# Patient Record
Sex: Female | Born: 1970
Health system: Southern US, Community
[De-identification: ages and names within clinical notes are randomized; demographics above are authoritative.]

## PROBLEM LIST (undated history)

## (undated) DIAGNOSIS — T7840XA Allergy, unspecified, initial encounter: Secondary | ICD-10-CM

## (undated) HISTORY — DX: Allergy, unspecified, initial encounter: T78.40XA

## (undated) HISTORY — PX: WISDOM TOOTH EXTRACTION: SHX21

---

## 2003-07-18 ENCOUNTER — Other Ambulatory Visit: Admission: RE | Admit: 2003-07-18 | Discharge: 2003-07-18 | Payer: Self-pay | Admitting: Obstetrics and Gynecology

## 2004-08-02 ENCOUNTER — Other Ambulatory Visit: Admission: RE | Admit: 2004-08-02 | Discharge: 2004-08-02 | Payer: Self-pay | Admitting: Obstetrics and Gynecology

## 2005-08-22 ENCOUNTER — Other Ambulatory Visit: Admission: RE | Admit: 2005-08-22 | Discharge: 2005-08-22 | Payer: Self-pay | Admitting: Obstetrics and Gynecology

## 2017-07-04 DIAGNOSIS — Z1231 Encounter for screening mammogram for malignant neoplasm of breast: Secondary | ICD-10-CM | POA: Diagnosis not present

## 2017-07-04 DIAGNOSIS — Z681 Body mass index (BMI) 19 or less, adult: Secondary | ICD-10-CM | POA: Diagnosis not present

## 2017-07-04 DIAGNOSIS — Z01419 Encounter for gynecological examination (general) (routine) without abnormal findings: Secondary | ICD-10-CM | POA: Diagnosis not present

## 2017-07-14 DIAGNOSIS — Z808 Family history of malignant neoplasm of other organs or systems: Secondary | ICD-10-CM | POA: Diagnosis not present

## 2017-07-14 DIAGNOSIS — Z8041 Family history of malignant neoplasm of ovary: Secondary | ICD-10-CM | POA: Diagnosis not present

## 2017-07-14 DIAGNOSIS — Z803 Family history of malignant neoplasm of breast: Secondary | ICD-10-CM | POA: Diagnosis not present

## 2017-07-14 DIAGNOSIS — Z800 Family history of malignant neoplasm of digestive organs: Secondary | ICD-10-CM | POA: Diagnosis not present

## 2017-08-09 DIAGNOSIS — Z0000 Encounter for general adult medical examination without abnormal findings: Secondary | ICD-10-CM | POA: Diagnosis not present

## 2018-01-10 DIAGNOSIS — D225 Melanocytic nevi of trunk: Secondary | ICD-10-CM | POA: Diagnosis not present

## 2018-01-10 DIAGNOSIS — L700 Acne vulgaris: Secondary | ICD-10-CM | POA: Diagnosis not present

## 2018-01-20 DIAGNOSIS — Z23 Encounter for immunization: Secondary | ICD-10-CM | POA: Diagnosis not present

## 2018-08-30 DIAGNOSIS — Z681 Body mass index (BMI) 19 or less, adult: Secondary | ICD-10-CM | POA: Diagnosis not present

## 2018-08-30 DIAGNOSIS — Z01419 Encounter for gynecological examination (general) (routine) without abnormal findings: Secondary | ICD-10-CM | POA: Diagnosis not present

## 2018-08-30 DIAGNOSIS — Z1231 Encounter for screening mammogram for malignant neoplasm of breast: Secondary | ICD-10-CM | POA: Diagnosis not present

## 2018-09-28 DIAGNOSIS — Z0000 Encounter for general adult medical examination without abnormal findings: Secondary | ICD-10-CM | POA: Diagnosis not present

## 2018-10-02 DIAGNOSIS — R7989 Other specified abnormal findings of blood chemistry: Secondary | ICD-10-CM | POA: Diagnosis not present

## 2018-10-04 DIAGNOSIS — Z0000 Encounter for general adult medical examination without abnormal findings: Secondary | ICD-10-CM | POA: Diagnosis not present

## 2018-10-04 DIAGNOSIS — Z1331 Encounter for screening for depression: Secondary | ICD-10-CM | POA: Diagnosis not present

## 2019-02-15 DIAGNOSIS — Z23 Encounter for immunization: Secondary | ICD-10-CM | POA: Diagnosis not present

## 2019-03-12 DIAGNOSIS — D2261 Melanocytic nevi of right upper limb, including shoulder: Secondary | ICD-10-CM | POA: Diagnosis not present

## 2019-03-12 DIAGNOSIS — B078 Other viral warts: Secondary | ICD-10-CM | POA: Diagnosis not present

## 2019-03-12 DIAGNOSIS — D1801 Hemangioma of skin and subcutaneous tissue: Secondary | ICD-10-CM | POA: Diagnosis not present

## 2019-03-12 DIAGNOSIS — D485 Neoplasm of uncertain behavior of skin: Secondary | ICD-10-CM | POA: Diagnosis not present

## 2019-03-12 DIAGNOSIS — D2271 Melanocytic nevi of right lower limb, including hip: Secondary | ICD-10-CM | POA: Diagnosis not present

## 2019-03-12 DIAGNOSIS — D225 Melanocytic nevi of trunk: Secondary | ICD-10-CM | POA: Diagnosis not present

## 2019-07-19 ENCOUNTER — Ambulatory Visit: Payer: Self-pay | Attending: Internal Medicine

## 2019-07-19 DIAGNOSIS — Z23 Encounter for immunization: Secondary | ICD-10-CM

## 2019-07-19 NOTE — Progress Notes (Signed)
   Covid-19 Vaccination Clinic  Name:  Gloria Turner    MRN: [id number] DOB: 01/05/1971  07/19/2019  [name] was observed post Covid-19 immunization for 15 minutes without incident. She was provided with Vaccine Information Sheet and instruction to access the V-Safe system.   [name] was instructed to call 911 with any severe reactions post vaccine: Marland Kitchen Difficulty breathing  . Swelling of face and throat  . A fast heartbeat  . A bad rash all over body  . Dizziness and weakness   Immunizations Administered    Name Date Dose VIS Date Route   Pfizer COVID-19 Vaccine 07/19/2019 12:26 PM 0.3 mL 04/19/2019 Intramuscular   Manufacturer: [employer], [employer]   Lot: [id number]   NDC: [address]-3

## 2019-07-23 DIAGNOSIS — L570 Actinic keratosis: Secondary | ICD-10-CM | POA: Diagnosis not present

## 2019-08-13 ENCOUNTER — Ambulatory Visit: Payer: Self-pay | Attending: Internal Medicine

## 2019-08-13 DIAGNOSIS — Z23 Encounter for immunization: Secondary | ICD-10-CM

## 2019-10-15 DIAGNOSIS — Z681 Body mass index (BMI) 19 or less, adult: Secondary | ICD-10-CM | POA: Diagnosis not present

## 2019-10-15 DIAGNOSIS — Z13220 Encounter for screening for lipoid disorders: Secondary | ICD-10-CM | POA: Diagnosis not present

## 2019-10-15 DIAGNOSIS — Z1231 Encounter for screening mammogram for malignant neoplasm of breast: Secondary | ICD-10-CM | POA: Diagnosis not present

## 2019-10-15 DIAGNOSIS — Z01419 Encounter for gynecological examination (general) (routine) without abnormal findings: Secondary | ICD-10-CM | POA: Diagnosis not present

## 2020-02-20 DIAGNOSIS — H6592 Unspecified nonsuppurative otitis media, left ear: Secondary | ICD-10-CM | POA: Diagnosis not present

## 2020-03-31 DIAGNOSIS — L570 Actinic keratosis: Secondary | ICD-10-CM | POA: Diagnosis not present

## 2020-03-31 DIAGNOSIS — D2262 Melanocytic nevi of left upper limb, including shoulder: Secondary | ICD-10-CM | POA: Diagnosis not present

## 2020-03-31 DIAGNOSIS — Z419 Encounter for procedure for purposes other than remedying health state, unspecified: Secondary | ICD-10-CM | POA: Diagnosis not present

## 2020-03-31 DIAGNOSIS — D2271 Melanocytic nevi of right lower limb, including hip: Secondary | ICD-10-CM | POA: Diagnosis not present

## 2020-03-31 DIAGNOSIS — D1801 Hemangioma of skin and subcutaneous tissue: Secondary | ICD-10-CM | POA: Diagnosis not present

## 2020-04-17 DIAGNOSIS — H9312 Tinnitus, left ear: Secondary | ICD-10-CM | POA: Diagnosis not present

## 2020-04-17 DIAGNOSIS — H6983 Other specified disorders of Eustachian tube, bilateral: Secondary | ICD-10-CM | POA: Diagnosis not present

## 2020-05-11 DIAGNOSIS — Z03818 Encounter for observation for suspected exposure to other biological agents ruled out: Secondary | ICD-10-CM | POA: Diagnosis not present

## 2020-05-18 DIAGNOSIS — Z03818 Encounter for observation for suspected exposure to other biological agents ruled out: Secondary | ICD-10-CM | POA: Diagnosis not present

## 2020-06-26 ENCOUNTER — Encounter: Payer: Self-pay | Admitting: Internal Medicine

## 2020-07-09 ENCOUNTER — Telehealth: Payer: Self-pay | Admitting: Internal Medicine

## 2020-07-09 NOTE — Telephone Encounter (Signed)
Communication with patient's husband who is an anesthesiologist.  She currently has an April 13 appointment in the [facility] but they prefer to do it at [facility] with his groups anesthesia.   Please look at the following dates and times to see what may work for [place].  I know these dates are acceptable for them.  April 12 midday  April 13 midday 1230-if we choose this then blocked her appointment or block out the 10:00 so it cannot be filled (that would be better) so that I make it on time  May 5 730 or midday  May 23 I am off this day but will do it preferably at 730-ish  Please let me know what we come up with and also contact the patient to let her know what we are doing as may need to adjust prep instructions and she will need a hospital Covid test at least as things are today  You can wait until Monday to contact them because she is on vacation this week but if these dates work for me go ahead and set it up because I know they are good for them

## 2020-07-10 ENCOUNTER — Other Ambulatory Visit: Payer: Self-pay

## 2020-07-10 DIAGNOSIS — Z1211 Encounter for screening for malignant neoplasm of colon: Secondary | ICD-10-CM

## 2020-07-10 NOTE — Telephone Encounter (Signed)
Case has been rescheduled to [facility] on 08/18/20 at 12:30.  She will require a COVID screen on 08/14/20 at 10:30.  She has pre-visit scheduled for 08/05/20.  [facility] case cancelled.  I will contact patient on Monday with the details.

## 2020-07-13 NOTE — Telephone Encounter (Signed)
Left message for patient to call back  

## 2020-07-14 NOTE — Telephone Encounter (Signed)
Patient notified of the dates and times.  They all work for her.

## 2020-08-05 ENCOUNTER — Ambulatory Visit (AMBULATORY_SURGERY_CENTER): Payer: BC Managed Care – PPO

## 2020-08-05 ENCOUNTER — Other Ambulatory Visit: Payer: Self-pay

## 2020-08-05 ENCOUNTER — Telehealth: Payer: Self-pay

## 2020-08-05 VITALS — Ht 63.5 in | Wt 108.0 lb

## 2020-08-05 DIAGNOSIS — Z1211 Encounter for screening for malignant neoplasm of colon: Secondary | ICD-10-CM

## 2020-08-05 NOTE — Telephone Encounter (Signed)
Gloria Turner came for her pre-visit today and she was unaware that her covid screen has to be done 08/14/20 and she will be out of town. They no longer test on Saturdays. Her procedure is at [facility] on 08/18/20. Any suggestions Sir?

## 2020-08-05 NOTE — Progress Notes (Signed)
Pre visit completed vis phone call; Patient verified name, DOB, and address; No egg or soy allergy known to patient  No issues with past sedation with any surgeries or procedures Patient denies ever being told they had issues or difficulty with intubation  No FH of Malignant Hyperthermia No diet pills per patient No home 02 use per patient  No blood thinners per patient  Pt denies issues with constipation  No A fib or A flutter  EMMI video via MyChart  COVID 19 guidelines implemented in [facility] today with Pt and RN  NO PA's for preps discussed with pt in [facility] today  Discussed with pt there will be an out-of-pocket cost for prep and that varies from $0 to 70 dollars  Due to the COVID-19 pandemic we are asking patients to follow certain guidelines.  Pt aware of COVID protocols and [facility] guidelines

## 2020-08-06 NOTE — Telephone Encounter (Signed)
I left her a message on both numbers that her covid test has been moved to 08/14/20 at 2:45pm. That is the latest they do.

## 2020-08-06 NOTE — Telephone Encounter (Signed)
I have communicated with the patient and Debi Scientist, research (life sciences).  It is probably fine to do the test on Monday morning for her Tuesday afternoon procedure but the patient think she can be in town late Friday afternoon  see if a Friday afternoon appointment is possible and sort that out with her -

## 2020-08-10 NOTE — Telephone Encounter (Signed)
Left her another message on the work #, the home # rang and rang and then cut off.

## 2020-08-14 ENCOUNTER — Other Ambulatory Visit (HOSPITAL_COMMUNITY): Payer: Self-pay

## 2020-08-14 ENCOUNTER — Other Ambulatory Visit (HOSPITAL_COMMUNITY)
Admission: RE | Admit: 2020-08-14 | Discharge: 2020-08-14 | Disposition: A | Discharge status: Home / Self Care | Payer: BC Managed Care – PPO | Source: Ambulatory Visit | Attending: Internal Medicine | Admitting: Internal Medicine

## 2020-08-14 DIAGNOSIS — Z20822 Contact with and (suspected) exposure to covid-19: Secondary | ICD-10-CM | POA: Insufficient documentation

## 2020-08-14 DIAGNOSIS — Z01812 Encounter for preprocedural laboratory examination: Secondary | ICD-10-CM | POA: Insufficient documentation

## 2020-08-14 LAB — SARS CORONAVIRUS 2 (TAT 6-24 HRS): SARS Coronavirus 2: NEGATIVE

## 2020-08-17 NOTE — Telephone Encounter (Signed)
I see in the computer she made it to her covid screening appointment Friday.

## 2020-08-18 ENCOUNTER — Encounter (HOSPITAL_COMMUNITY)
Admission: RE | Disposition: A | Discharge status: Home / Self Care | Payer: Self-pay | Source: Home / Self Care | Attending: Internal Medicine

## 2020-08-18 ENCOUNTER — Ambulatory Visit (HOSPITAL_COMMUNITY)
Admission: RE | Admit: 2020-08-18 | Discharge: 2020-08-18 | Disposition: A | Discharge status: Home / Self Care | Payer: BC Managed Care – PPO | Attending: Internal Medicine | Admitting: Internal Medicine

## 2020-08-18 ENCOUNTER — Ambulatory Visit (HOSPITAL_COMMUNITY): Payer: BC Managed Care – PPO | Admitting: Certified Registered Nurse Anesthetist

## 2020-08-18 ENCOUNTER — Other Ambulatory Visit: Payer: Self-pay

## 2020-08-18 ENCOUNTER — Encounter (HOSPITAL_COMMUNITY): Payer: Self-pay | Admitting: Internal Medicine

## 2020-08-18 DIAGNOSIS — Z8371 Family history of colonic polyps: Secondary | ICD-10-CM | POA: Diagnosis not present

## 2020-08-18 DIAGNOSIS — Z1211 Encounter for screening for malignant neoplasm of colon: Secondary | ICD-10-CM | POA: Diagnosis not present

## 2020-08-18 DIAGNOSIS — Z800 Family history of malignant neoplasm of digestive organs: Secondary | ICD-10-CM | POA: Insufficient documentation

## 2020-08-18 DIAGNOSIS — Z79899 Other long term (current) drug therapy: Secondary | ICD-10-CM | POA: Insufficient documentation

## 2020-08-18 HISTORY — PX: COLONOSCOPY WITH PROPOFOL: SHX5780

## 2020-08-18 SURGERY — COLONOSCOPY WITH PROPOFOL
Anesthesia: Monitor Anesthesia Care

## 2020-08-18 MED ORDER — PROPOFOL 500 MG/50ML IV EMUL
INTRAVENOUS | Status: AC
  Filled 2020-08-18: qty 50

## 2020-08-18 MED ORDER — ONDANSETRON HCL 4 MG/2ML IJ SOLN
INTRAMUSCULAR | Status: DC | PRN
  Administered 2020-08-18: 4 mg via INTRAVENOUS

## 2020-08-18 MED ORDER — PROPOFOL 500 MG/50ML IV EMUL
INTRAVENOUS | Status: DC | PRN
  Administered 2020-08-18: 100 ug/kg/min via INTRAVENOUS

## 2020-08-18 MED ORDER — PROPOFOL 10 MG/ML IV BOLUS
INTRAVENOUS | Status: DC | PRN
  Administered 2020-08-18 (×3): 20 mg via INTRAVENOUS

## 2020-08-18 MED ORDER — LACTATED RINGERS IV SOLN
INTRAVENOUS | Status: DC
  Administered 2020-08-18: 1000 mL via INTRAVENOUS

## 2020-08-18 SURGICAL SUPPLY — 21 items

## 2020-08-18 NOTE — Anesthesia Procedure Notes (Signed)
Procedure Name: MAC Date/Time: 08/18/2020 12:36 PM Performed by: Maxwell Caul, CRNA Pre-anesthesia Checklist: Patient identified, Emergency Drugs available, Suction available and Patient being monitored Oxygen Delivery Method: Simple face mask

## 2020-08-18 NOTE — Anesthesia Preprocedure Evaluation (Addendum)
Anesthesia Evaluation  Patient identified by MRN, date of birth, ID band Patient awake    Reviewed: Allergy & Precautions, NPO status , Patient's Chart, lab work & pertinent test results  Airway Mallampati: II  TM Distance: >3 FB Neck ROM: Full    Dental no notable dental hx.    Pulmonary neg pulmonary ROS,    Pulmonary exam normal breath sounds clear to auscultation       Cardiovascular negative cardio ROS Normal cardiovascular exam Rhythm:Regular Rate:Normal     Neuro/Psych negative neurological ROS  negative psych ROS   GI/Hepatic Neg liver ROS, Bowel prep,  Endo/Other  negative endocrine ROS  Renal/GU negative Renal ROS     Musculoskeletal negative musculoskeletal ROS (+)   Abdominal   Peds  Hematology negative hematology ROS (+)   Anesthesia Other Findings screening colonoscopy  Reproductive/Obstetrics                           Anesthesia Physical Anesthesia Plan  ASA: I  Anesthesia Plan: MAC   Post-op Pain Management:    Induction: Intravenous  PONV Risk Score and Plan: 2 and Propofol infusion and Treatment may vary due to age or medical condition  Airway Management Planned: Simple Face Mask  Additional Equipment:   Intra-op Plan:   Post-operative Plan:   Informed Consent: I have reviewed the patients History and Physical, chart, labs and discussed the procedure including the risks, benefits and alternatives for the proposed anesthesia with the patient or authorized representative who has indicated his/her understanding and acceptance.     Dental advisory given  Plan Discussed with: CRNA  Anesthesia Plan Comments:        Anesthesia Quick Evaluation

## 2020-08-18 NOTE — H&P (Signed)
  [facility] History and Physical   Primary Care Physician:  Chilton Greathouse, MD   Reason for Procedure:   colon cancer screening  Plan:    colonoscopy     HPI: Gloria Turner is a 50 y.o. female here for screening colonoscopy.   Past Medical History:  Diagnosis Date  . Allergy    seasonal allergies    Past Surgical History:  Procedure Laterality Date  . CESAREAN SECTION     x 2  . WISDOM TOOTH EXTRACTION      Prior to Admission medications   Medication Sig Start Date End Date Taking? Authorizing Provider  fluticasone (FLONASE) 50 MCG/ACT nasal spray Place 1 spray into both nostrils daily as needed for allergies or rhinitis (Seasonal).   Yes [provider]    No current facility-administered medications for this encounter.    Allergies as of 07/10/2020  . (Not on File)    Family History  Problem Relation Age of Onset  . Colon polyps Father 25  . Colon cancer Maternal Grandmother 5  . Colon polyps Maternal Grandmother 29  . Colon cancer Maternal Grandfather 11  . Colon polyps Maternal Grandfather 70  . Stomach cancer Paternal Grandmother 77  . Esophageal cancer Neg Hx   . Rectal cancer Neg Hx    Social History   Social History Narrative   Married - 1 son 1 daughter    Husband Dr. Autumn Patty   Self-employed trainer   occ EtOH never smoker/drugs/tobacco      Review of Systems:  Otherwise negative Physical Exam: Vital signs in last 24 hours: Temp:  [98.6 F (37 C)] 98.6 F (37 C) (04/12 1151) Resp:  [20] 20 (04/12 1151) BP: (131)/(70) 131/70 (04/12 1151) SpO2:  [98 %] 98 % (04/12 1151) Weight:  [49 kg] 49 kg (04/12 1151)   General:   Alert,  Well-developed, well-nourished, pleasant and cooperative in NAD Lungs:  Clear throughout to auscultation.   Heart:  Regular rate and rhythm; no murmurs, clicks, rubs,  or gallops. Abdomen:  Soft, nontender and nondistended. Normal bowel sounds.   Neuro/Psych:  Alert and  cooperative. Normal mood and affect. A and O x 3   @Analisse Randle  , MD, [facility] [phone] (pager) 08/18/2020 12:04 PM@

## 2020-08-18 NOTE — Transfer of Care (Signed)
Immediate Anesthesia Transfer of Care Note  Patient: Gloria Turner  Procedure(s) Performed: COLONOSCOPY WITH PROPOFOL (N/A )  Patient Location: PACU and Endoscopy Unit  Anesthesia Type:MAC  Level of Consciousness: awake, alert  and oriented  Airway & Oxygen Therapy: Patient Spontanous Breathing and Patient connected to face mask oxygen  Post-op Assessment: Report given to RN and Post -op Vital signs reviewed and stable  Post vital signs: Reviewed and stable  Last Vitals:  Vitals Value Taken Time  BP    Temp    Pulse 67 08/18/20 1317  Resp    SpO2 100 % 08/18/20 1317  Vitals shown include unvalidated device data.  Last Pain:  Vitals:   08/18/20 1151  TempSrc: Oral  PainSc: 0-No pain         Complications: No complications documented.

## 2020-08-18 NOTE — Discharge Instructions (Signed)
The colonoscopy was totally normal with an excellent prep.  Next routine colonoscopy or other screening test in 10 years - 2032.   I appreciate the opportunity to care for you. Iva Boop, MD, FACG  YOU HAD AN ENDOSCOPIC PROCEDURE TODAY: Refer to the procedure report and other information in the discharge instructions given to you for any specific questions about what was found during the examination. If this information does not answer your questions, please call Dr. Marvell Fuller office at [phone] to clarify.   YOU SHOULD EXPECT: Some feelings of bloating in the abdomen. Passage of more gas than usual. Walking can help get rid of the air that was put into your GI tract during the procedure and reduce the bloating. If you had a lower endoscopy (such as a colonoscopy or flexible sigmoidoscopy) you may notice spotting of blood in your stool or on the toilet paper. Some abdominal soreness may be present for a day or two, also.  DIET: Your first meal following the procedure should be a light meal and then it is ok to progress to your normal diet. A half-sandwich or bowl of soup is an example of a good first meal. Heavy or fried foods are harder to digest and may make you feel nauseous or bloated. Drink plenty of fluids but you should avoid alcoholic beverages for 24 hours.   ACTIVITY: Your care partner should take you home directly after the procedure. You should plan to take it easy, moving slowly for the rest of the day. You can resume normal activity the day after the procedure however YOU SHOULD NOT DRIVE, use power tools, machinery or perform tasks that involve climbing or major physical exertion for 24 hours (because of the sedation medicines used during the test).   SYMPTOMS TO REPORT IMMEDIATELY: A gastroenterologist can be reached at any hour. Please call [phone]  for any of the following symptoms:  Following lower endoscopy (colonoscopy, flexible sigmoidoscopy) Excessive amounts  of blood in the stool  Significant tenderness, worsening of abdominal pains  Swelling of the abdomen that is new, acute  Fever of 100 or higher

## 2020-08-18 NOTE — Op Note (Signed)
[facility] Patient Name: Gloria Turner Procedure Date: 08/18/2020 MRN: [id number] Attending MD: Iva Boop , MD Date of Birth: Sep 12, 1970 CSN: [id number] Age: 50 Admit Type: Outpatient Procedure:                Colonoscopy Indications:              Screening for colorectal malignant neoplasm, This                            is the patient's first colonoscopy Providers:                Iva Boop, MD, Shelda Jakes, RN, Brion Aliment, Technician Referring MD:             Chilton Greathouse MD Medicines:                Propofol per Anesthesia, Monitored Anesthesia Care Complications:            No immediate complications. Estimated Blood Loss:     Estimated blood loss: none. Procedure:                Pre-Anesthesia Assessment:                           - Prior to the procedure, a History and Physical                            was performed, and patient medications and                            allergies were reviewed. The patient's tolerance of                            previous anesthesia was also reviewed. The risks                            and benefits of the procedure and the sedation                            options and risks were discussed with the patient.                            All questions were answered, and informed consent                            was obtained. Prior Anticoagulants: The patient has                            taken no previous anticoagulant or antiplatelet                            agents. ASA Grade Assessment: I - A normal, healthy  patient. After reviewing the risks and benefits,                            the patient was deemed in satisfactory condition to                            undergo the procedure.                           After obtaining informed consent, the colonoscope                            was passed under direct vision. Throughout the                             procedure, the patient's blood pressure, pulse, and                            oxygen saturations were monitored continuously. The                            CF-[id number] ([id number]) Olympus colonoscope was                            introduced through the anus and advanced to the the                            cecum, identified by appendiceal orifice and                            ileocecal valve. The patient tolerated the                            procedure well. The colonoscopy was somewhat                            difficult due to significant looping. Successful                            completion of the procedure was aided by applying                            abdominal pressure. The ileocecal valve,                            appendiceal orifice, and rectum were photographed.                            The bowel preparation used was Miralax via split                            dose instruction. The quality of the bowel  preparation was excellent. Scope In: 12:46:15 PM Scope Out: 1:10:09 PM Scope Withdrawal Time: 0 hours 13 minutes 36 seconds  Total Procedure Duration: 0 hours 23 minutes 54 seconds  Findings:      The perianal and digital rectal examinations were normal.      The entire examined colon appeared normal on direct and retroflexion       views. Impression:               - The entire examined colon is normal on direct and                            retroflexion views.                           - No specimens collected. Moderate Sedation:      Not Applicable - Patient had care per Anesthesia. Recommendation:           - Patient has a contact number available for                            emergencies. The signs and symptoms of potential                            delayed complications were discussed with the                            patient. Return to normal activities tomorrow.                            Written discharge  instructions were provided to the                            patient.                           - Resume previous diet.                           - Continue present medications.                           - Repeat colonoscopy or other appropriate test in                            10 years for screening purposes. Procedure Code(s):        --- Professional ---                           Z6109G0121, Colorectal cancer screening; colonoscopy on                            individual not meeting criteria for high risk Diagnosis Code(s):        --- Professional ---                           Z12.11, Encounter for screening for malignant  neoplasm of colon CPT copyright 2019 American Medical Association. All rights reserved. The codes documented in this report are preliminary and upon coder review may  be revised to meet current compliance requirements. Iva Boop, MD 08/18/2020 1:18:53 PM This report has been signed electronically. Number of Addenda: 0

## 2020-08-19 ENCOUNTER — Encounter (HOSPITAL_COMMUNITY): Payer: Self-pay | Admitting: Internal Medicine

## 2020-08-19 ENCOUNTER — Encounter: Payer: Self-pay | Admitting: Internal Medicine

## 2020-08-19 NOTE — Anesthesia Postprocedure Evaluation (Signed)
Anesthesia Post Note  Patient: Gloria Turner  Procedure(s) Performed: COLONOSCOPY WITH PROPOFOL (N/A )     Patient location during evaluation: Endoscopy Anesthesia Type: MAC Level of consciousness: awake Pain management: pain level controlled Vital Signs Assessment: post-procedure vital signs reviewed and stable Respiratory status: spontaneous breathing, nonlabored ventilation, respiratory function stable and patient connected to nasal cannula oxygen Cardiovascular status: stable and blood pressure returned to baseline Postop Assessment: no apparent nausea or vomiting Anesthetic complications: no   No complications documented.  Last Vitals:  Vitals:   08/18/20 1330 08/18/20 1340  BP: (!) 106/57 106/65  Pulse: (!) 52 (!) 53  Resp: 13 (!) 21  Temp:    SpO2: 95% 100%    Last Pain:  Vitals:   08/18/20 1340  TempSrc:   PainSc: 0-No pain                 Laural Eiland P Lakhia Gengler

## 2020-08-26 DIAGNOSIS — L820 Inflamed seborrheic keratosis: Secondary | ICD-10-CM | POA: Diagnosis not present

## 2020-08-26 DIAGNOSIS — L570 Actinic keratosis: Secondary | ICD-10-CM | POA: Diagnosis not present

## 2020-10-15 DIAGNOSIS — Z13828 Encounter for screening for other musculoskeletal disorder: Secondary | ICD-10-CM | POA: Diagnosis not present

## 2020-11-17 DIAGNOSIS — R7989 Other specified abnormal findings of blood chemistry: Secondary | ICD-10-CM | POA: Diagnosis not present

## 2020-11-18 DIAGNOSIS — L282 Other prurigo: Secondary | ICD-10-CM | POA: Diagnosis not present

## 2020-11-18 DIAGNOSIS — L309 Dermatitis, unspecified: Secondary | ICD-10-CM | POA: Diagnosis not present

## 2020-11-19 DIAGNOSIS — Z01419 Encounter for gynecological examination (general) (routine) without abnormal findings: Secondary | ICD-10-CM | POA: Diagnosis not present

## 2020-11-19 DIAGNOSIS — Z1231 Encounter for screening mammogram for malignant neoplasm of breast: Secondary | ICD-10-CM | POA: Diagnosis not present

## 2020-11-19 DIAGNOSIS — Z681 Body mass index (BMI) 19 or less, adult: Secondary | ICD-10-CM | POA: Diagnosis not present

## 2020-12-10 ENCOUNTER — Ambulatory Visit: Payer: BC Managed Care – PPO | Admitting: Student

## 2020-12-10 ENCOUNTER — Encounter: Payer: Self-pay | Admitting: Student

## 2020-12-10 ENCOUNTER — Inpatient Hospital Stay: Payer: BC Managed Care – PPO

## 2020-12-10 ENCOUNTER — Other Ambulatory Visit: Payer: Self-pay

## 2020-12-10 VITALS — BP 97/64 | HR 64 | Temp 98.7°F | Ht 63.5 in | Wt 107.8 lb

## 2020-12-10 DIAGNOSIS — R5383 Other fatigue: Secondary | ICD-10-CM

## 2020-12-10 DIAGNOSIS — R002 Palpitations: Secondary | ICD-10-CM

## 2020-12-10 DIAGNOSIS — Z136 Encounter for screening for cardiovascular disorders: Secondary | ICD-10-CM

## 2020-12-10 MED ORDER — VERAPAMIL HCL 40 MG PO TABS: 40.0000 mg | ORAL_TABLET | Freq: Three times a day (TID) | ORAL | 3 refills | Status: DC | PRN

## 2020-12-10 NOTE — Progress Notes (Signed)
Primary Physician/Referring:  Chilton Greathouse, MD  Patient ID: Gloria Turner, female    DOB: [birth year], 50 y.o.   MRN: [id number]  Chief Complaint  Patient presents with   Palpitations   svt   HPI:    Gloria Turner  is a 50 y.o. 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  She is without significant past medical history, no family history of premature CAD (mother MI in 73s). Patient did have COVID-19 infection in January 2022.  She is referred to our office for evaluation of palpitations.   Patient reports following COVID-19 infection starting sometime in January or February she has had occasional episodes of palpitations, reporting 5-6 episodes over the last 6 to 8 months.  Episodes occur following rigorous exercise with heart rate noted 150-190 bpm lasting 2 to 5 minutes and resolved with Valsalva maneuvers.  Patient denies associated symptoms.  Denies chest pain, dizziness, syncope, near syncope, dyspnea.  She notes that her resting heart rate is typically around 50 bpm.  Patient and her husband are taking a trip at the end of this month to spend 4 weeks biking and hiking, therefore patient opted to be evaluated for episodes of palpitations prior to leaving.  Our office spoke directly with patient's PCP who has recently done lab evaluation for patient, reports lab panel was within normal limits.  Past Medical History:  Diagnosis Date   Allergy    seasonal allergies   Past Surgical History:  Procedure Laterality Date   CESAREAN SECTION     x 2   COLONOSCOPY WITH PROPOFOL N/A 08/18/2020   Procedure: COLONOSCOPY WITH PROPOFOL;  Surgeon: Iva Boop, MD;  Location: [facility];  Service: Endoscopy;  Laterality: N/A;   WISDOM TOOTH EXTRACTION     Family History  Problem Relation Age of Onset   Colon polyps Father 54   Colon cancer Maternal Grandmother 55   Colon polyps Maternal Grandmother 55   Colon cancer Maternal Grandfather 55   Colon  polyps Maternal Grandfather 55   Stomach cancer Paternal Grandmother 2   Esophageal cancer Neg Hx    Rectal cancer Neg Hx     Social History   Tobacco Use   Smoking status: Never   Smokeless tobacco: Never  Substance Use Topics   Alcohol use: Yes    Alcohol/week: 4.0 standard drinks    Types: 4 Standard drinks or equivalent per week   Marital Status: Married   ROS  Review of Systems  Cardiovascular:  Positive for palpitations. Negative for chest pain, claudication, leg swelling, near-syncope, orthopnea, paroxysmal nocturnal dyspnea and syncope.  Respiratory:  Negative for shortness of breath.   Neurological:  Negative for dizziness.   Objective  Blood pressure 97/64, pulse 64, temperature 98.7 F (37.1 C), height 5' 3.5" (1.613 m), weight 107 lb 12.8 oz (48.9 kg), SpO2 97 %.  Vitals with BMI 12/10/2020 08/18/2020 08/18/2020  Height 5' 3.5" - -  Weight 107 lbs 13 oz - -  BMI 18.79 - -  Systolic 97 106 106  Diastolic 64 65 57  Pulse 64 53 52      Physical Exam Vitals reviewed.  Constitutional:      Comments: Programmer, systems build  HENT:     Head: Normocephalic and atraumatic.  Cardiovascular:     Rate and Rhythm: Normal rate and regular rhythm.     Pulses: Intact distal pulses.          Carotid pulses are  2+ on the right side and 2+ on the left side.      Radial pulses are 2+ on the right side and 2+ on the left side.       Femoral pulses are 2+ on the right side and 2+ on the left side.      Popliteal pulses are 2+ on the right side and 2+ on the left side.       Posterior tibial pulses are 2+ on the right side and 2+ on the left side.     Heart sounds: S1 normal and S2 normal. No murmur heard.   No gallop.  Pulmonary:     Effort: Pulmonary effort is normal.     Breath sounds: Normal breath sounds.  Musculoskeletal:     Right lower leg: No edema.     Left lower leg: No edema.  Neurological:     Mental Status: She is alert.    Laboratory examination:   No  results for input(s): NA, K, CL, CO2, GLUCOSE, BUN, CREATININE, CALCIUM, GFRNONAA, GFRAA in the last 8760 hours. CrCl cannot be calculated (No successful lab value found.).  No flowsheet data found. No flowsheet data found.  Lipid Panel No results for input(s): CHOL, TRIG, LDLCALC, VLDL, HDL, CHOLHDL, LDLDIRECT in the last 8760 hours.  HEMOGLOBIN A1C No results found for: HGBA1C, MPG TSH No results for input(s): TSH in the last 8760 hours.  External labs:   11/17/2020: HDL 97, LDL 110, total cholesterol 220, triglycerides 76  09/27/2020:  BUN 17, creatinine 0.8  Allergies  No Known Allergies   Medications Prior to Visit:   Outpatient Medications Prior to Visit  Medication Sig Dispense Refill   fluticasone (FLONASE) 50 MCG/ACT nasal spray Place 1 spray into both nostrils daily as needed for allergies or rhinitis (Seasonal).     augmented betamethasone dipropionate (DIPROLENE-AF) 0.05 % cream betamethasone, augmented 0.05 % topical cream     predniSONE (DELTASONE) 10 MG tablet Take 40 mg by mouth daily.     No facility-administered medications prior to visit.   Final Medications at End of Visit    Current Meds  Medication Sig   fluticasone (FLONASE) 50 MCG/ACT nasal spray Place 1 spray into both nostrils daily as needed for allergies or rhinitis (Seasonal).   verapamil (CALAN) 40 MG tablet Take 1 tablet (40 mg total) by mouth 3 (three) times daily as needed. Take up to 2 pills at onset of symptoms.   Radiology:   No results found.  Cardiac Studies:   None   EKG:   12/10/2020: Sinus bradycardia at a rate of 54 bpm.  Normal axis.  No evidence of ischemia or underlying injury pattern.  Probable U wave, normal variant.  Normal EKG.  Assessment     ICD-10-CM   1. Palpitations  R00.2 EKG 12-Lead    PCV ECHOCARDIOGRAM COMPLETE    LONG TERM MONITOR (3-14 DAYS)    verapamil (CALAN) 40 MG tablet    CBC    Basic metabolic panel    TSH    2. Encounter for screening for  coronary artery disease  Z13.6 CT CARDIAC SCORING ([facility] LOCATIONS ONLY)    3. Other fatigue  R53.83 PCV ECHOCARDIOGRAM COMPLETE       Medications Discontinued During This Encounter  Medication Reason   predniSONE (DELTASONE) 10 MG tablet Error   augmented betamethasone dipropionate (DIPROLENE-AF) 0.05 % cream Error    Meds ordered this encounter  Medications   verapamil (CALAN) 40 MG tablet  Sig: Take 1 tablet (40 mg total) by mouth 3 (three) times daily as needed. Take up to 2 pills at onset of symptoms.    Dispense:  30 tablet    Refill:  3    Recommendations:   Genasis Zingale is a 50 y.o. Caucasian female with no significant cardiovascular risk factors or medical history.  Patient does have history of COVID-19 infection in 05/2020.  Referred to our office for evaluation of palpitations.   Patient symptoms are highly suggestive of supraventricular tachycardia.  We will obtain Zio patch monitor.  We will also obtain echocardiogram to evaluate for underlying structural abnormalities.  Patient is also requested coronary calcium scoring, will order this for further restratification.  Discussed with patient Valsalva maneuvers and encouraged her to continue with healthy diet and lifestyle.  We will also prescribe verapamil 40 mg to be taken as needed for symptoms persisting >5 to 10 minutes.  Discussed indications and use of verapamil, patient verbalized understanding agreement.   I have personally reviewed external labs, lipids are well controlled.  We will obtain CBC, BMP, and TSH to further evaluate underlying cause of patient's symptoms.   Follow up and further recommendations pending results of cardiovascular testing.   Patient was seen in collaboration with Dr. Jacinto Halim. He also reviewed patient's chart and examined the patient. Dr. Jacinto Halim is in agreement of the plan.   During this visit I reviewed and updated: Tobacco history  allergies medication reconciliation  medical history   surgical history  family history  social history.  This note was created using a voice recognition software as a result there may be grammatical errors inadvertently enclosed that do not reflect the nature of this encounter. Every attempt is made to correct such errors.   Rayford Halsted, PA-C 12/10/2020, 10:52 AM Office: [phone]

## 2020-12-11 DIAGNOSIS — R002 Palpitations: Secondary | ICD-10-CM | POA: Diagnosis not present

## 2020-12-12 LAB — BASIC METABOLIC PANEL
BUN/Creatinine Ratio: 18 (ref 9–23)
BUN: 15 mg/dL (ref 6–24)
CO2: 27 mmol/L (ref 20–29)
Calcium: 9.6 mg/dL (ref 8.7–10.2)
Chloride: 96 mmol/L (ref 96–106)
Creatinine, Ser: 0.84 mg/dL (ref 0.57–1.00)
Glucose: 74 mg/dL (ref 65–99)
Potassium: 4.3 mmol/L (ref 3.5–5.2)
Sodium: 137 mmol/L (ref 134–144)
eGFR: 85 mL/min/{1.73_m2} (ref >59)

## 2020-12-12 LAB — CBC
Hematocrit: 43.8 % (ref 34.0–46.6)
Hemoglobin: 15 g/dL (ref 11.1–15.9)
MCH: 32.1 pg (ref 26.6–33.0)
MCHC: 34.2 g/dL (ref 31.5–35.7)
MCV: 94 fL (ref 79–97)
Platelets: 232 10*3/uL (ref 150–450)
RBC: 4.67 x10E6/uL (ref 3.77–5.28)
RDW: 11.8 % (ref 11.7–15.4)
WBC: 4.6 10*3/uL (ref 3.4–10.8)

## 2020-12-12 LAB — TSH: TSH: 2.07 u[IU]/mL (ref 0.450–4.500)

## 2020-12-14 ENCOUNTER — Other Ambulatory Visit: Payer: Self-pay

## 2020-12-14 ENCOUNTER — Ambulatory Visit: Payer: BC Managed Care – PPO

## 2020-12-14 DIAGNOSIS — R002 Palpitations: Secondary | ICD-10-CM

## 2020-12-14 DIAGNOSIS — R5383 Other fatigue: Secondary | ICD-10-CM

## 2020-12-14 DIAGNOSIS — Z0189 Encounter for other specified special examinations: Secondary | ICD-10-CM | POA: Diagnosis not present

## 2020-12-14 NOTE — Progress Notes (Signed)
Please notify patient labs are all within normal limits.

## 2020-12-14 NOTE — Progress Notes (Signed)
Called and spoke with patient regarding her lab results.

## 2020-12-17 NOTE — Progress Notes (Signed)
Called and informed patient. 

## 2020-12-25 DIAGNOSIS — R002 Palpitations: Secondary | ICD-10-CM | POA: Diagnosis not present

## 2020-12-29 ENCOUNTER — Ambulatory Visit
Admission: RE | Admit: 2020-12-29 | Discharge: 2020-12-29 | Disposition: A | Discharge status: Home / Self Care | Payer: No Typology Code available for payment source | Source: Ambulatory Visit | Attending: Student | Admitting: Student

## 2020-12-29 DIAGNOSIS — E789 Disorder of lipoprotein metabolism, unspecified: Secondary | ICD-10-CM | POA: Diagnosis not present

## 2020-12-29 DIAGNOSIS — Z136 Encounter for screening for cardiovascular disorders: Secondary | ICD-10-CM | POA: Diagnosis not present

## 2020-12-29 IMAGING — CT CT CARDIAC CORONARY ARTERY CALCIUM SCORE
3 series · 14 of 20 positions shown, 16 images · non-contrast
Comparison: None.

CLINICAL DATA: Screening, borderline cholesterol

EXAM:
CT CARDIAC CORONARY ARTERY CALCIUM SCORE
TECHNIQUE: Non-contrast imaging through the heart was performed using
prospective ECG gating. Image post processing was performed on an
independent workstation, allowing for quantitative analysis of the
heart and coronary arteries. Note that this exam targets the heart
and the chest was not imaged in its entirety.

[Series 2: calcium scoring 2.00 qr36 bestdiast 70% hrt calciu · axial · 0.26mm/px · z∈[+1631,+1715]mm · 4 of 70 slices shown]
[im 14/70  vessel]
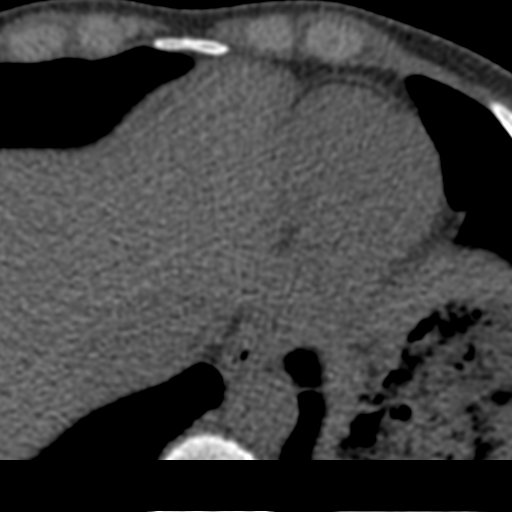
[im 28/70  vessel]
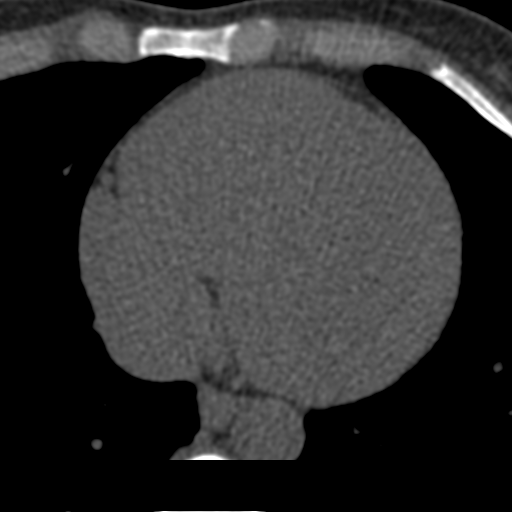
[im 42/70  vessel]
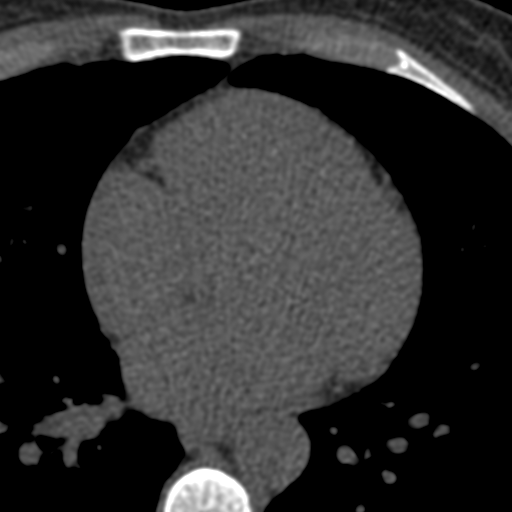
[im 56/70  vessel]
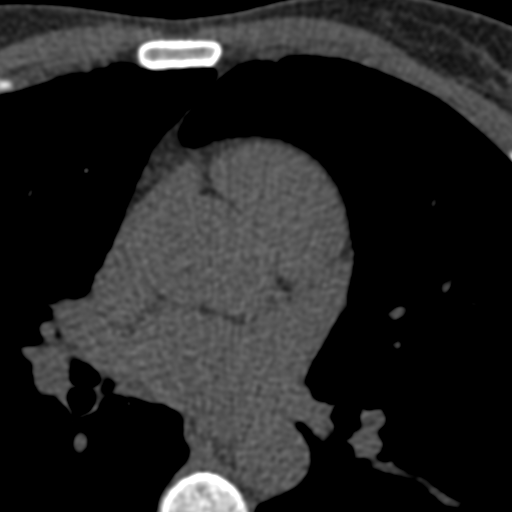

[Series 3: calcium scoring 2.00 br40 bestdiast 70% axial · axial · 0.39mm/px · z∈[+1627,+1719]mm · 5 of 70 slices shown, 7 images]
[im 12/70  vessel]
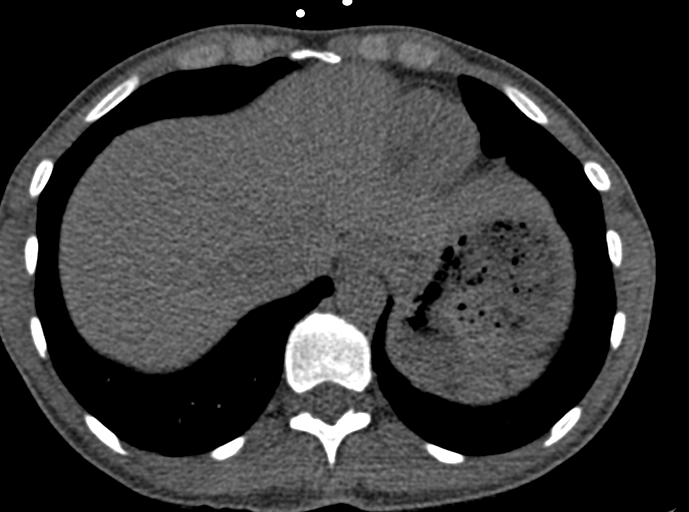
[im 12/70  lung]
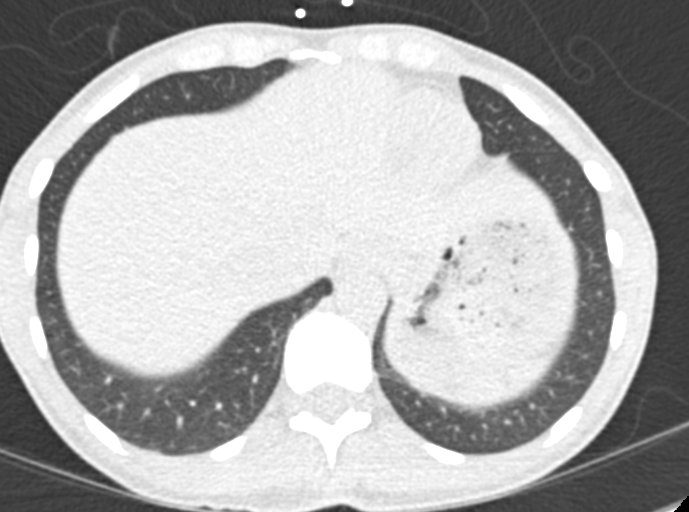
[im 24/70  vessel]
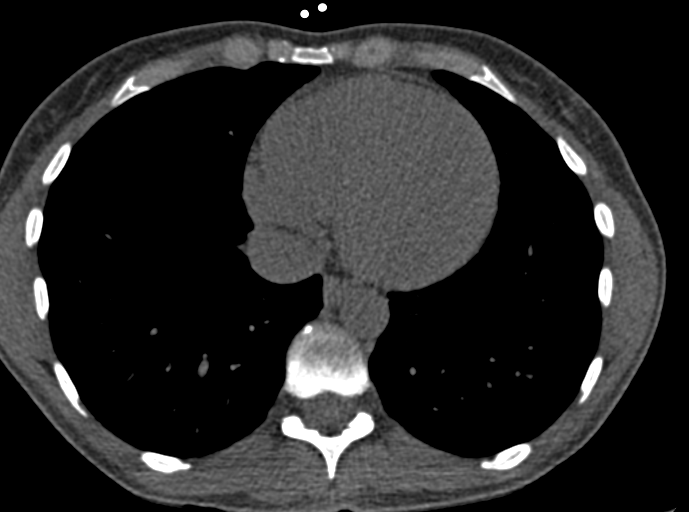
[im 35/70  vessel]
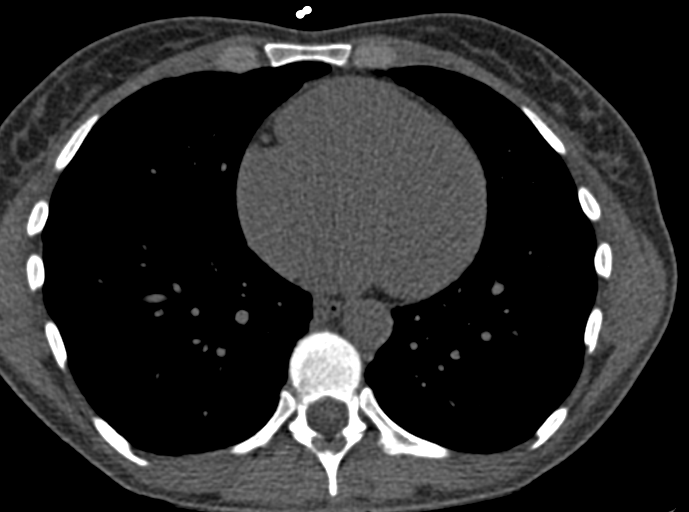
[im 47/70  vessel]
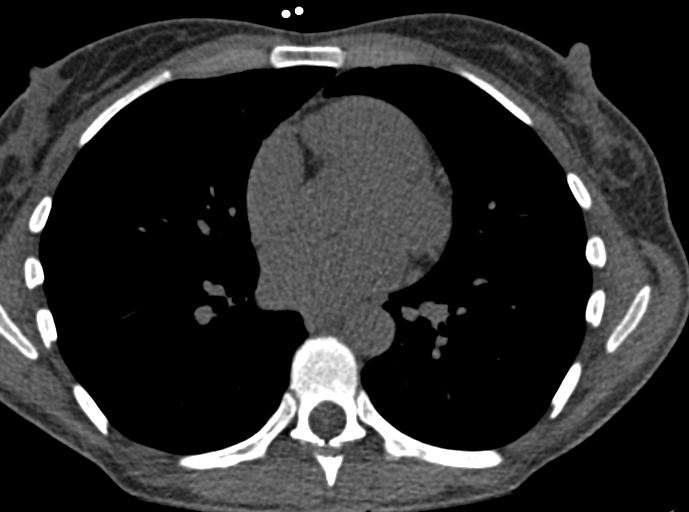
[im 58/70  vessel]
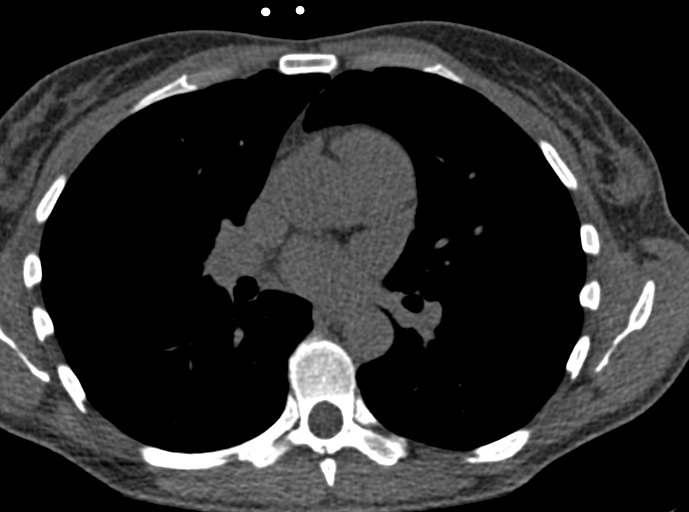
[im 58/70  lung]
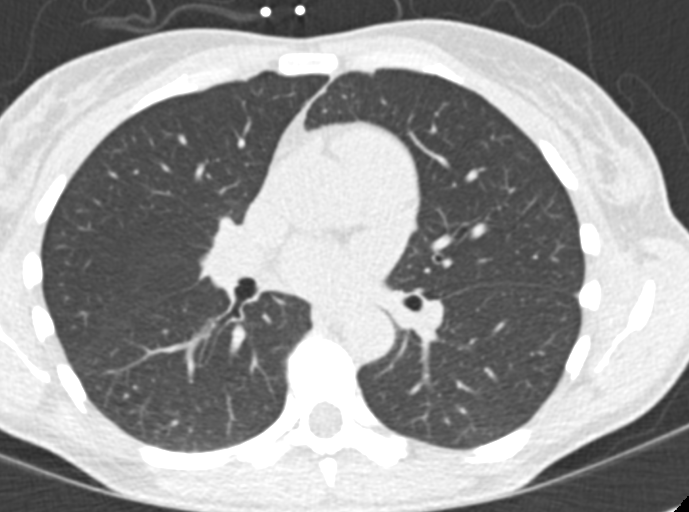

[Series 9: calcium scoring 2.00 br60 bestdiast 70% lungs · axial · 0.39mm/px · z∈[+1627,+1719]mm · 5 of 70 slices shown]
[im 12/70  vessel]
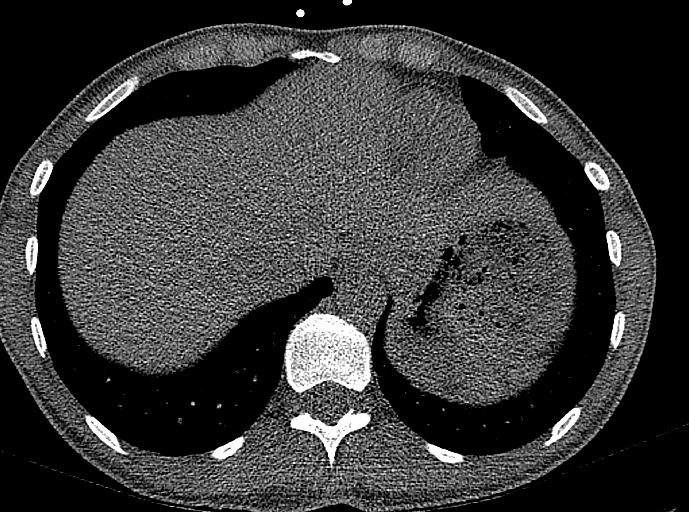
[im 24/70  vessel]
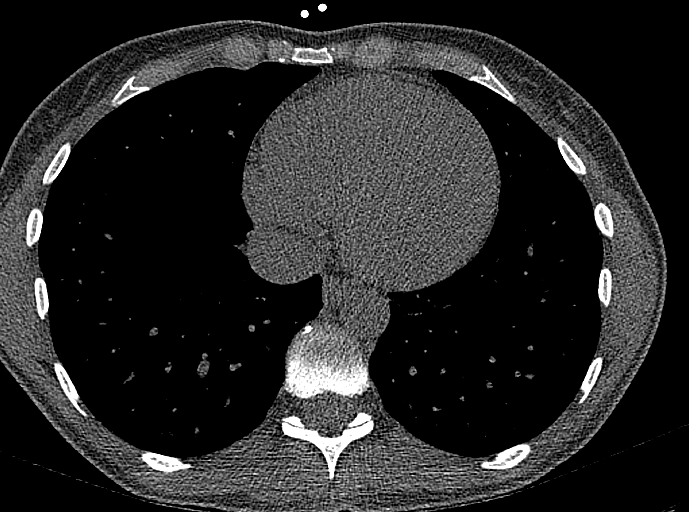
[im 35/70  vessel]
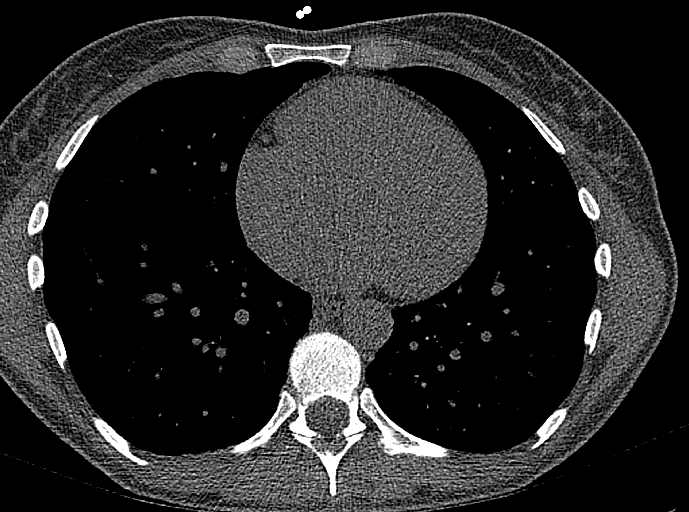
[im 47/70  vessel]
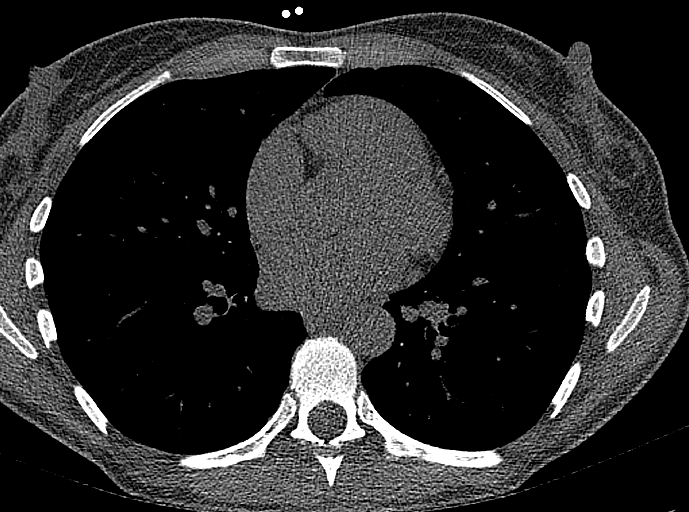
[im 58/70  vessel]
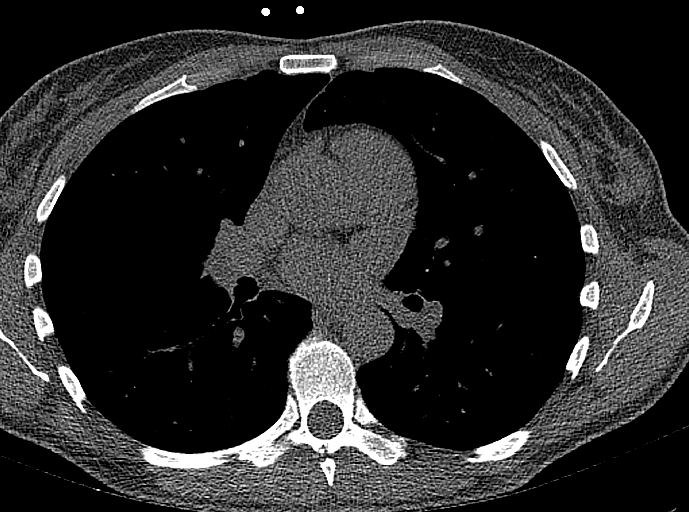

[14 of 20 positions shown; findings below may reference images not displayed]

FINDINGS: CORONARY CALCIUM SCORES:

Left Main: 0

LAD: 0

LCx: 0

RCA: 0

Total Agatston Score: 0

[HOSPITAL] percentile: 0

AORTA MEASUREMENTS:

Ascending Aorta: 28 mm

Descending Aorta: 19 mm

OTHER FINDINGS:

Heart is normal size. Aorta normal caliber. No adenopathy. No
confluent opacities or effusions. Imaging into the upper abdomen
demonstrates no acute findings. Chest wall soft tissues are
unremarkable. No acute bony abnormality.
IMPRESSION: No visible coronary artery calcifications. Total coronary calcium
score of 0.

No acute or significant extracardiac abnormality.

## 2020-12-30 NOTE — Progress Notes (Signed)
Spoke with patient and reviewed results.  She verbalized understanding.

## 2021-01-05 DIAGNOSIS — R002 Palpitations: Secondary | ICD-10-CM | POA: Diagnosis not present

## 2021-01-06 NOTE — Progress Notes (Signed)
Dr. Jacinto Halim to call patient to discuss further.

## 2021-03-09 ENCOUNTER — Encounter: Payer: Self-pay | Admitting: Orthopaedic Surgery

## 2021-03-09 ENCOUNTER — Other Ambulatory Visit: Payer: Self-pay

## 2021-03-09 ENCOUNTER — Ambulatory Visit (INDEPENDENT_AMBULATORY_CARE_PROVIDER_SITE_OTHER): Payer: BC Managed Care – PPO | Admitting: Orthopaedic Surgery

## 2021-03-09 ENCOUNTER — Ambulatory Visit: Payer: Self-pay

## 2021-03-09 DIAGNOSIS — M25562 Pain in left knee: Secondary | ICD-10-CM | POA: Diagnosis not present

## 2021-03-09 MED ORDER — MELOXICAM 15 MG PO TABS: 15.0000 mg | ORAL_TABLET | Freq: Every day | ORAL | 0 refills | Status: DC

## 2021-03-09 NOTE — Progress Notes (Signed)
Office Visit Note   Patient: Gloria Turner           Date of Birth: 06-Jul-1970           MRN: [id number] Visit Date: 03/09/2021              Requested by: Gloria Greathouse, MD [address] [place],  [place] [address] PCP: Gloria Greathouse, MD   Assessment & Plan: Visit Diagnoses:  1. Acute pain of left knee     Plan: Based on findings impression is osteoarthritis exacerbation due to overactivity.  I do not get the sense that she has a meniscal tear.  Based on treatment options we agreed to start with 2 weeks of scheduled meloxicam with rest from activity.  She will continue to use ice and by Voltaren gel over-the-counter.  She will follow-up in the office if she decides she wants a cortisone injection or she will reach out to me if she is interested in trying a prednisone Dosepak.  Follow-Up Instructions: No follow-ups on file.   Orders:  Orders Placed This Encounter  Procedures   XR KNEE 3 VIEW LEFT   Meds ordered this encounter  Medications   meloxicam (MOBIC) 15 MG tablet    Sig: Take 1 tablet (15 mg total) by mouth daily.    Dispense:  30 tablet    Refill:  0      Procedures: No procedures performed   Clinical Data: No additional findings.   Subjective: Chief Complaint  Patient presents with   Left Knee - Pain    Gloria Turner is a very pleasant 50 year old female wife of Gloria Turner who comes in for evaluation of left knee pain for about 2 to 3 months with noticeable decreased flexion and swelling.  Denies any injuries.  She feels tightness when doing certain movements such as child pose or heel slides.  She feels that it was acutely aggravated by recent hiking at her [facility] house.  Occasionally feels some locking catching.  She feels some posterior knee pain as well.  Has taken some NSAIDs but not regularly.  Denies any previous surgeries to the knee.   Review of Systems   Objective: Vital Signs: There were no vitals taken for this  visit.  Physical Exam  Ortho Exam  Left knee shows small joint effusion.  She has moderate discomfort with passive extension which she lacks about 15 degrees secondary to guarding and discomfort.  She has increased pain with flexion past 95degrees.  No joint line tenderness.  Patella tracking is normal.  No crepitus.  Collaterals and cruciates are stable.  Specialty Comments:  No specialty comments available.  Imaging: XR KNEE 3 VIEW LEFT  Result Date: 03/09/2021 Small suprapatellar effusion.  No evidence of degenerative joint disease.    PMFS History: There are no problems to display for this patient.  Past Medical History:  Diagnosis Date   Allergy    seasonal allergies    Family History  Problem Relation Age of Onset   Colon polyps Father 15   Colon cancer Maternal Grandmother 34   Colon polyps Maternal Grandmother 61   Colon cancer Maternal Grandfather 55   Colon polyps Maternal Grandfather 29   Stomach cancer Paternal Grandmother 32   Esophageal cancer Neg Hx    Rectal cancer Neg Hx     Past Surgical History:  Procedure Laterality Date   CESAREAN SECTION     x 2   COLONOSCOPY WITH PROPOFOL N/A 08/18/2020  Procedure: COLONOSCOPY WITH PROPOFOL;  Surgeon: Gloria Boop, MD;  Location: [facility];  Service: Endoscopy;  Laterality: N/A;   WISDOM TOOTH EXTRACTION     Social History   Occupational History   Not on file  Tobacco Use   Smoking status: Never   Smokeless tobacco: Never  Vaping Use   Vaping Use: Never used  Substance and Sexual Activity   Alcohol use: Yes    Alcohol/week: 4.0 standard drinks    Types: 4 Standard drinks or equivalent per week   Drug use: Never   Sexual activity: Yes

## 2021-03-16 ENCOUNTER — Ambulatory Visit: Payer: BC Managed Care – PPO | Admitting: Student

## 2021-03-16 ENCOUNTER — Encounter: Payer: Self-pay | Admitting: Student

## 2021-03-16 ENCOUNTER — Other Ambulatory Visit: Payer: Self-pay

## 2021-03-16 VITALS — BP 116/69 | HR 53 | Temp 98.3°F | Ht 63.5 in | Wt 110.0 lb

## 2021-03-16 DIAGNOSIS — R001 Bradycardia, unspecified: Secondary | ICD-10-CM | POA: Diagnosis not present

## 2021-03-16 DIAGNOSIS — I471 Supraventricular tachycardia: Secondary | ICD-10-CM

## 2021-03-16 DIAGNOSIS — R002 Palpitations: Secondary | ICD-10-CM | POA: Diagnosis not present

## 2021-03-16 NOTE — Progress Notes (Signed)
Primary Physician/Referring:  Gloria Solian, MD  Patient ID: Gloria Turner, female    DOB: 10/09/1970, 50 y.o.   MRN: [id number]  Chief Complaint  Patient presents with   Palpitations   Follow-up   Results   HPI:    Gloria Turner  is a 50 y.o. 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  She is without significant past medical history, no family history of premature CAD (mother MI in 2s). Patient did have COVID-19 infection in January 2022.  She was originally referred to our office for evaluation of palpitations.   Patient presents for 86-month follow-up.  Last office visit started patient on verapamil for as needed use for palpitations. Also obtained echocardiogram, which was normal, as well as cardiac monitor which revealed episodes of asymptomatic junctional escape as well as paroxysmal supraventricular tachycardia.  However patient's symptoms correlated with normal sinus rhythm.  Patient now presents for follow-up.  Since last office visit patient has gone an extended hiking and biking vacation.  Over the last few months she has only had 2 episodes of palpitations without other associated symptoms.  These episodes were brief lasting <3 minutes and resolved with Valsalva maneuvers.  Patient has not taken verapamil.  She remains physically active without issue.  Past Medical History:  Diagnosis Date   Allergy    seasonal allergies   Past Surgical History:  Procedure Laterality Date   CESAREAN SECTION     x 2   COLONOSCOPY WITH PROPOFOL N/A 08/18/2020   Procedure: COLONOSCOPY WITH PROPOFOL;  Surgeon: Gatha Mayer, MD;  Location: [facility];  Service: Endoscopy;  Laterality: N/A;   WISDOM TOOTH EXTRACTION     Family History  Problem Relation Age of Onset   Colon polyps Father 25   Colon cancer Maternal Grandmother 55   Colon polyps Maternal Grandmother 55   Colon cancer Maternal Grandfather 55   Colon polyps Maternal Grandfather 16    Stomach cancer Paternal Grandmother 74   Esophageal cancer Neg Hx    Rectal cancer Neg Hx     Social History   Tobacco Use   Smoking status: Never   Smokeless tobacco: Never  Substance Use Topics   Alcohol use: Yes    Alcohol/week: 4.0 standard drinks    Types: 4 Standard drinks or equivalent per week   Marital Status: Married   ROS  Review of Systems  Cardiovascular:  Positive for palpitations (2 episodes in last 3 months, brief). Negative for chest pain, claudication, leg swelling, near-syncope, orthopnea, paroxysmal nocturnal dyspnea and syncope.  Respiratory:  Negative for shortness of breath.   Neurological:  Negative for dizziness.   Objective  Blood pressure 116/69, pulse (!) 53, temperature 98.3 F (36.8 C), height 5' 3.5" (1.613 m), weight 110 lb (49.9 kg), SpO2 99 %.  Vitals with BMI 03/16/2021 12/10/2020 08/18/2020  Height 5' 3.5" 5' 3.5" -  Weight 110 lbs 107 lbs 13 oz -  BMI [id number] -  Systolic 99991111 97 [id number]  Diastolic 69 64 65  Pulse 53 64 53      Physical Exam Vitals reviewed.  Constitutional:      Comments: Mining engineer build  HENT:     Head: Normocephalic and atraumatic.  Cardiovascular:     Rate and Rhythm: Normal rate and regular rhythm.     Pulses: Intact distal pulses.          Carotid pulses are 2+ on the right side and 2+  on the left side.      Radial pulses are 2+ on the right side and 2+ on the left side.       Femoral pulses are 2+ on the right side and 2+ on the left side.      Popliteal pulses are 2+ on the right side and 2+ on the left side.       Posterior tibial pulses are 2+ on the right side and 2+ on the left side.     Heart sounds: S1 normal and S2 normal. No murmur heard.   No gallop.  Pulmonary:     Effort: Pulmonary effort is normal.     Breath sounds: Normal breath sounds.  Musculoskeletal:     Right lower leg: No edema.     Left lower leg: No edema.  Neurological:     Mental Status: She is alert.  Physical exam unchanged  compared to previous office visit.  Laboratory examination:   Recent Labs    12/11/20 1415  NA 137  K 4.3  CL 96  CO2 27  GLUCOSE 74  BUN 15  CREATININE 0.84  CALCIUM 9.6   CrCl cannot be calculated (Patient's most recent lab result is older than the maximum 21 days allowed.).  CMP Latest Ref Rng & Units 12/11/2020  Glucose 65 - 99 mg/dL 74  BUN 6 - 24 mg/dL 15  Creatinine 4.23 - 5.36 mg/dL 1.44  Sodium 315 - 400 mmol/L 137  Potassium 3.5 - 5.2 mmol/L 4.3  Chloride 96 - 106 mmol/L 96  CO2 20 - 29 mmol/L 27  Calcium 8.7 - 10.2 mg/dL 9.6   CBC Latest Ref Rng & Units 12/11/2020  WBC 3.4 - 10.8 x10E3/uL 4.6  Hemoglobin 11.1 - 15.9 g/dL 86.7  Hematocrit 61.9 - 46.6 % 43.8  Platelets 150 - 450 x10E3/uL 232    Lipid Panel No results for input(s): CHOL, TRIG, LDLCALC, VLDL, HDL, CHOLHDL, LDLDIRECT in the last 8760 hours.  HEMOGLOBIN A1C No results found for: HGBA1C, MPG TSH Recent Labs    12/11/20 1415  TSH 2.070    External labs:   11/17/2020: HDL 97, LDL 110, total cholesterol 220, triglycerides 76  09/27/2020:  BUN 17, creatinine 0.8  Allergies  No Known Allergies   Medications Prior to Visit:   Outpatient Medications Prior to Visit  Medication Sig Dispense Refill   fluticasone (FLONASE) 50 MCG/ACT nasal spray Place 1 spray into both nostrils daily as needed for allergies or rhinitis (Seasonal).     meloxicam (MOBIC) 15 MG tablet Take 1 tablet (15 mg total) by mouth daily. 30 tablet 0   verapamil (CALAN) 40 MG tablet Take 1 tablet (40 mg total) by mouth 3 (three) times daily as needed. Take up to 2 pills at onset of symptoms. (Patient not taking: Reported on 03/16/2021) 30 tablet 3   No facility-administered medications prior to visit.   Final Medications at End of Visit    Current Meds  Medication Sig   fluticasone (FLONASE) 50 MCG/ACT nasal spray Place 1 spray into both nostrils daily as needed for allergies or rhinitis (Seasonal).   meloxicam (MOBIC) 15  MG tablet Take 1 tablet (15 mg total) by mouth daily.   Radiology:   No results found.  Cardiac Studies:   Ambulatory cardiac telemetry 12/10/2020 - 12/21/2020: Predominant underlying rhythm was sinus.  Minimum heart rate 39 bpm, maximum heart rate 187 bpm, average heart rate 63 bpm.  Occasional episodes of marked bradycardia, asymptomatic junctional  escape beat at a rate of 42 bpm.  Episodes of paroxysmal supraventricular tachycardia, asymptomatic.  Rare PACs and PVCs.  Patient triggered events correlated with sinus rhythm.   PCV ECHOCARDIOGRAM COMPLETE 12/14/2020 Left ventricle cavity is normal in size and wall thickness. Normal global wall motion. Normal LV systolic function with EF 66%. Normal diastolic filling pattern. Structurally normal trileaflet aortic valve. Trace aortic regurgitation. No evidence of pulmonary hypertension.   EKG:   12/10/2020: Sinus bradycardia at a rate of 54 bpm.  Normal axis.  No evidence of ischemia or underlying injury pattern.  Probable U wave, normal variant.  Normal EKG.  Assessment     ICD-10-CM   1. Palpitations  R00.2     2. SVT (supraventricular tachycardia) (HCC)  I47.1     3. Bradycardia by electrocardiogram  R00.1        There are no discontinued medications.   No orders of the defined types were placed in this encounter.   Recommendations:   Tenli Kado is a 50 y.o. 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  She is without significant past medical history, no family history of premature CAD (mother MI in 2s). Patient did have COVID-19 infection in January 2022.  She was originally referred to our office for evaluation of palpitations.   Patient presents for 53-month follow-up.  Last office visit started patient on verapamil for as needed use for palpitations. Also obtained echocardiogram, which was normal, as well as cardiac monitor which revealed episodes of asymptomatic junctional escape as well as  paroxysmal supraventricular tachycardia.  However patient's symptoms correlated with normal sinus rhythm.  Patient now presents for follow-up.  Reviewed and discussed with patient results of echocardiogram and cardiac monitor, details above.  Patient's questions were addressed.  Patient only has occasional episodes of palpitations likely due to underlying paroxysmal SVT.  Episodes are brief and resolved with Valsalva maneuvers.  Given that patient's echocardiogram is normal and she only has occasional brief episodes of palpitations will not make changes at today's office visit.  Advised patient to continue to carry verapamil for as needed use.  Could consider referral to electrophysiology and consideration for ablation in the future if patient's episodes worsen or become more frequent, however do not feel this is necessary at this time.  Follow-up in 1 year, sooner if needed paroxysmal SVT and palpitations. For   Alethia Berthold, PA-C 03/16/2021, 12:02 PM Office: [phone]

## 2021-04-07 ENCOUNTER — Telehealth: Payer: Self-pay | Admitting: Orthopaedic Surgery

## 2021-04-07 ENCOUNTER — Other Ambulatory Visit: Payer: Self-pay

## 2021-04-07 DIAGNOSIS — M25562 Pain in left knee: Secondary | ICD-10-CM

## 2021-04-07 DIAGNOSIS — G8929 Other chronic pain: Secondary | ICD-10-CM

## 2021-04-07 NOTE — Telephone Encounter (Signed)
I spoke to Gloria Turner today about ongoing left knee pain despite rest, nsaids and conservative treatment.  At this time, we will need to get an MRI to rule out structural abnormality.

## 2021-04-21 DIAGNOSIS — D225 Melanocytic nevi of trunk: Secondary | ICD-10-CM | POA: Diagnosis not present

## 2021-04-21 DIAGNOSIS — L821 Other seborrheic keratosis: Secondary | ICD-10-CM | POA: Diagnosis not present

## 2021-04-21 DIAGNOSIS — D2261 Melanocytic nevi of right upper limb, including shoulder: Secondary | ICD-10-CM | POA: Diagnosis not present

## 2021-04-21 DIAGNOSIS — D2221 Melanocytic nevi of right ear and external auricular canal: Secondary | ICD-10-CM | POA: Diagnosis not present

## 2021-04-22 ENCOUNTER — Telehealth: Payer: Self-pay | Admitting: Orthopaedic Surgery

## 2021-04-22 NOTE — Telephone Encounter (Signed)
Called pt 1X and left vm for pt to call and set MRI review appt with Dr.XU after 05/11/21

## 2021-05-04 ENCOUNTER — Other Ambulatory Visit: Payer: BC Managed Care – PPO

## 2021-05-11 ENCOUNTER — Other Ambulatory Visit: Payer: Self-pay

## 2021-05-11 ENCOUNTER — Ambulatory Visit
Admission: RE | Admit: 2021-05-11 | Discharge: 2021-05-11 | Disposition: A | Discharge status: Home / Self Care | Payer: BC Managed Care – PPO | Source: Ambulatory Visit | Attending: Orthopaedic Surgery | Admitting: Orthopaedic Surgery

## 2021-05-11 DIAGNOSIS — G8929 Other chronic pain: Secondary | ICD-10-CM

## 2021-05-11 DIAGNOSIS — M7122 Synovial cyst of popliteal space [Baker], left knee: Secondary | ICD-10-CM | POA: Diagnosis not present

## 2021-05-11 DIAGNOSIS — M25462 Effusion, left knee: Secondary | ICD-10-CM | POA: Diagnosis not present

## 2021-05-11 IMAGING — MR MR KNEE*L* W/O CM
4 of 7 series · 23 of 40 positions shown · non-contrast
Comparison: X-ray knee [DATE].

CLINICAL DATA: Left anterior/inferior knee pain and instability,
and notes worse with flexion since [DATE].

EXAM:
MRI OF THE LEFT KNEE WITHOUT CONTRAST
TECHNIQUE: Multiplanar, multisequence MR imaging of the knee was performed. No
intravenous contrast was administered.

[Series 3: T2 fat-sat · axial · 4.0mm · 0.50mm/px · z∈[-110,+5]mm · 7 of 24 slices shown]
[im 1/24]
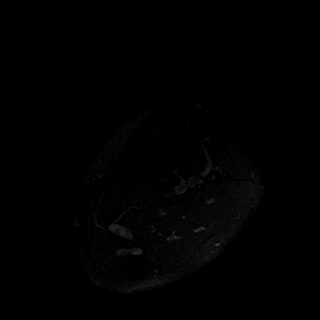
[im 4/24]
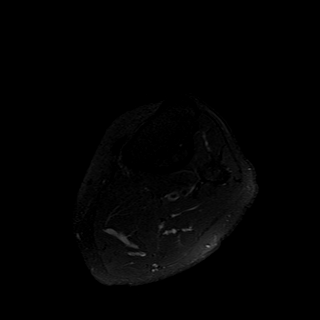
[im 8/24]
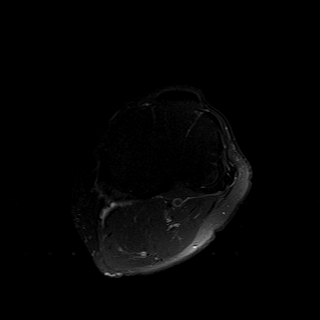
[im 12/24]
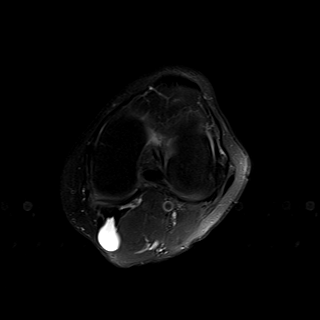
[im 16/24]
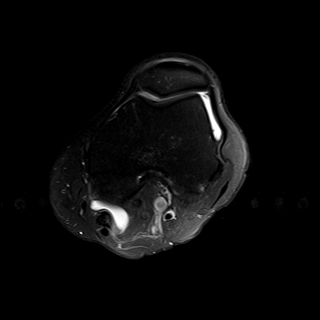
[im 20/24]
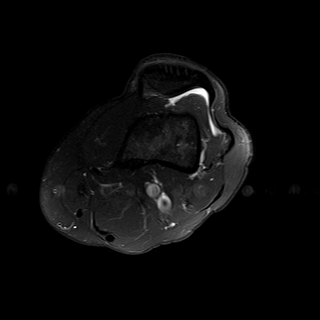
[im 24/24]
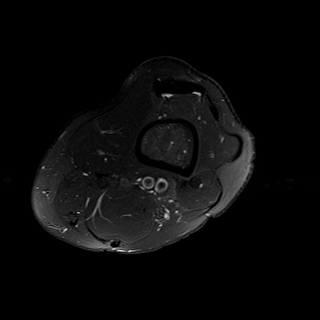

[Series 7: PD fat-sat · sagittal · 3.0mm · 0.29mm/px · 7 of 28 slices shown (1 of 3)]
[im 1/28]
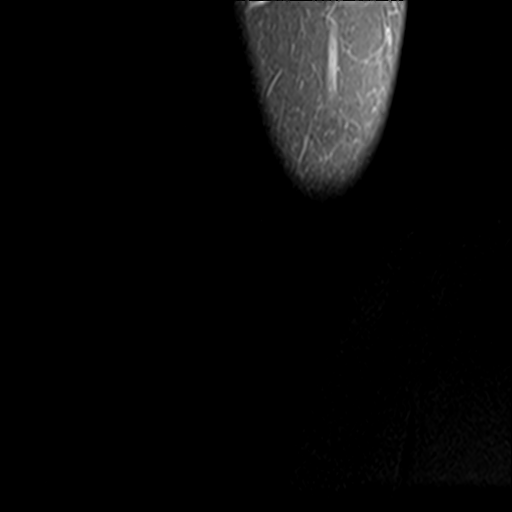
[im 5/28]
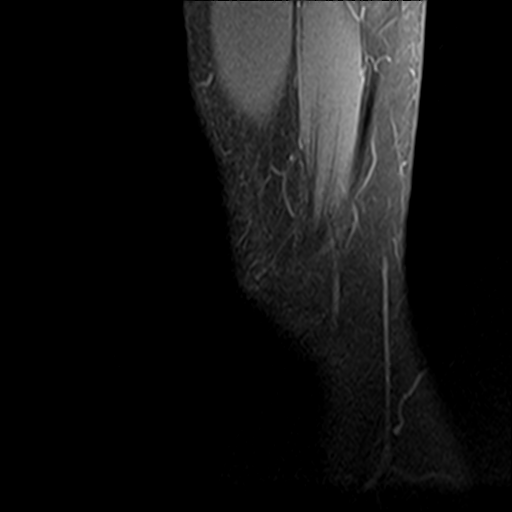
[im 10/28]
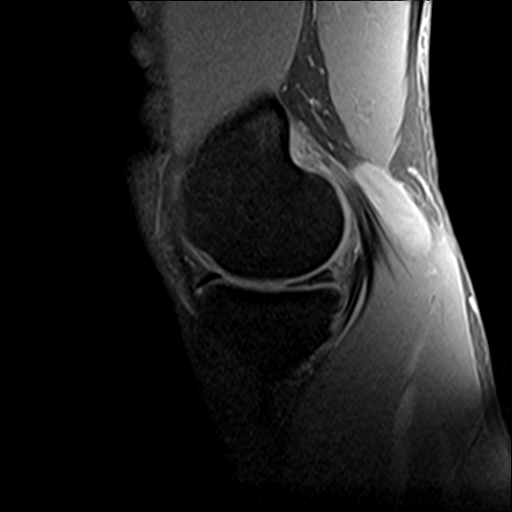
[im 14/28]
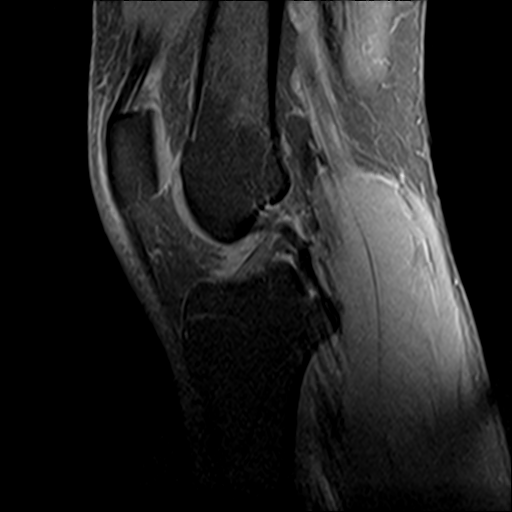
[im 19/28]
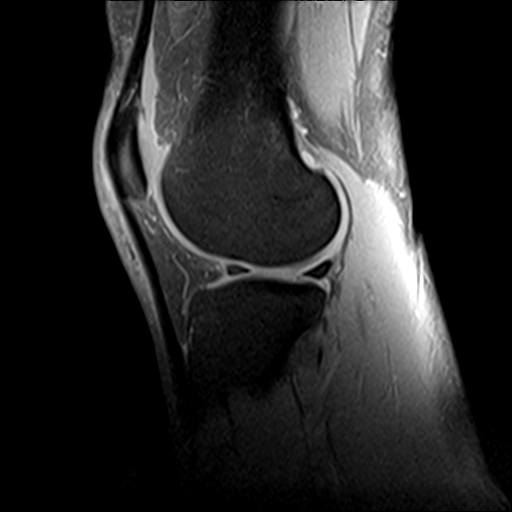
[im 23/28]
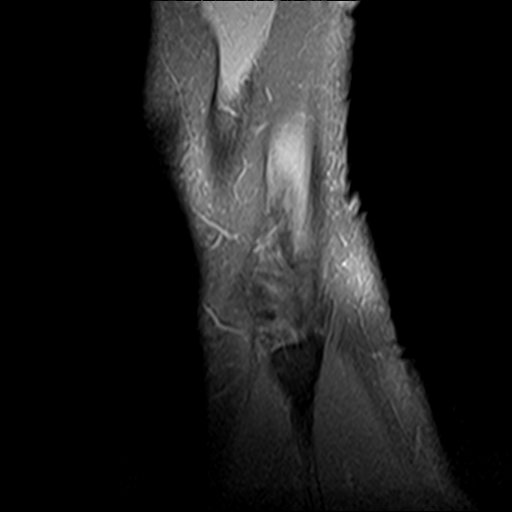
[im 28/28]
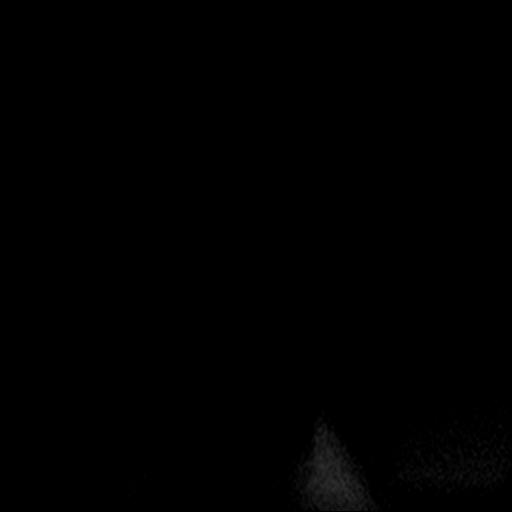

[Series 8: PD fat-sat · coronal · 3.0mm · 0.29mm/px · 6 of 25 slices shown (2 of 3)]
[im 1/25]
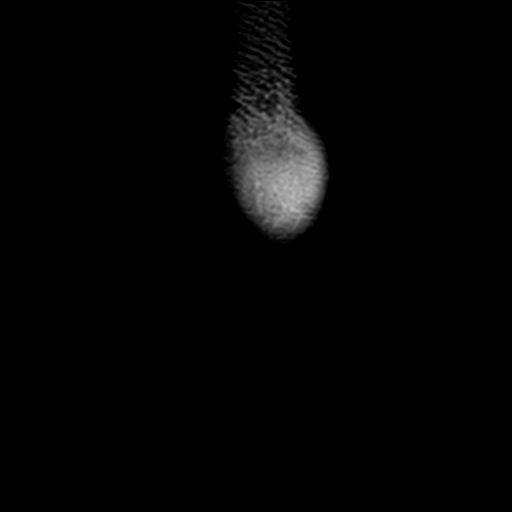
[im 5/25]
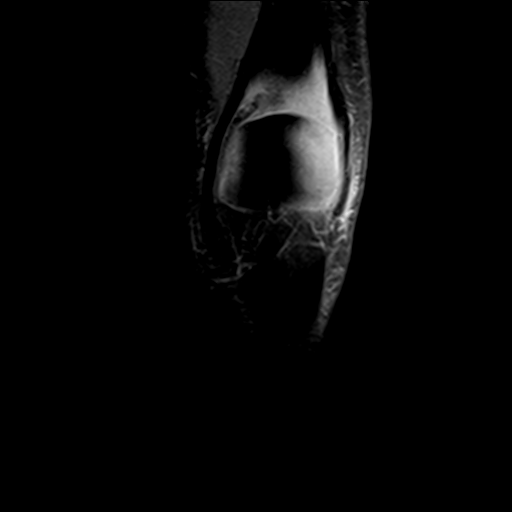
[im 10/25]
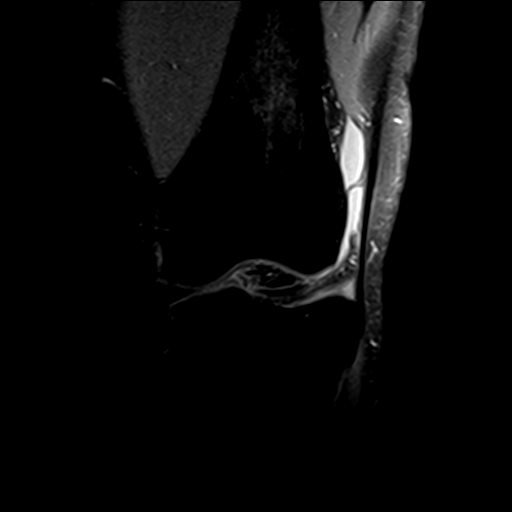
[im 15/25]
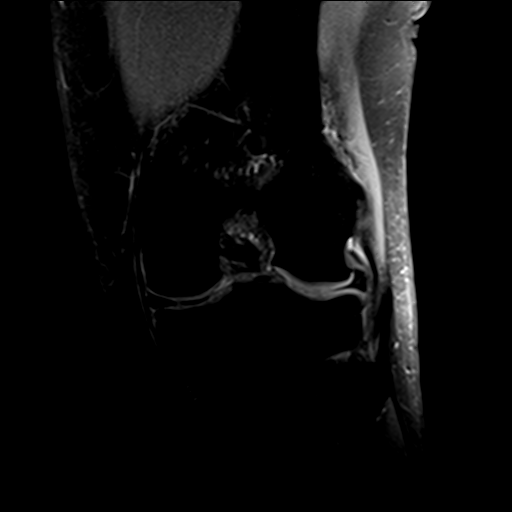
[im 20/25]
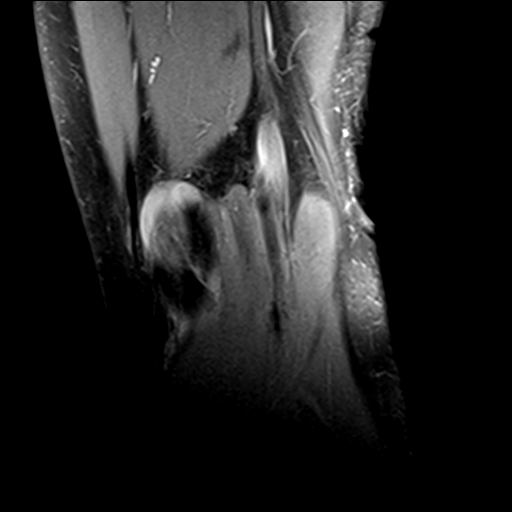
[im 25/25]
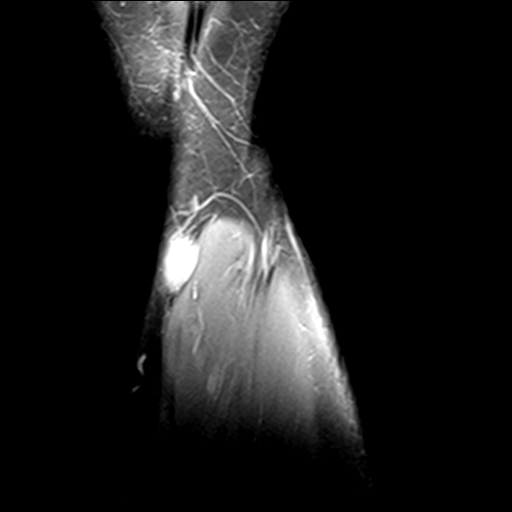

[Series 9: PD fat-sat · oblique · 2.3mm · 0.29mm/px · 3 of 11 slices shown (3 of 3)]
[im 1/11]
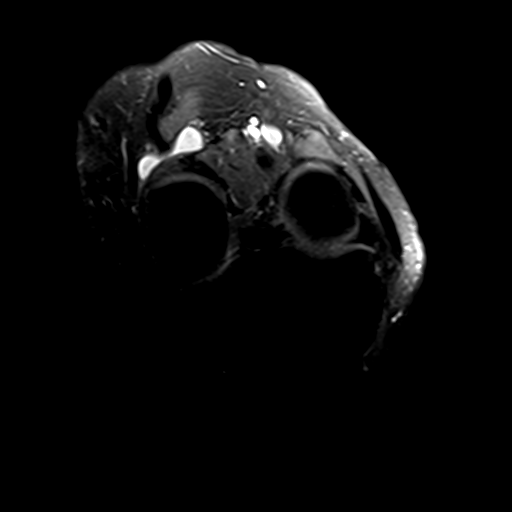
[im 6/11]
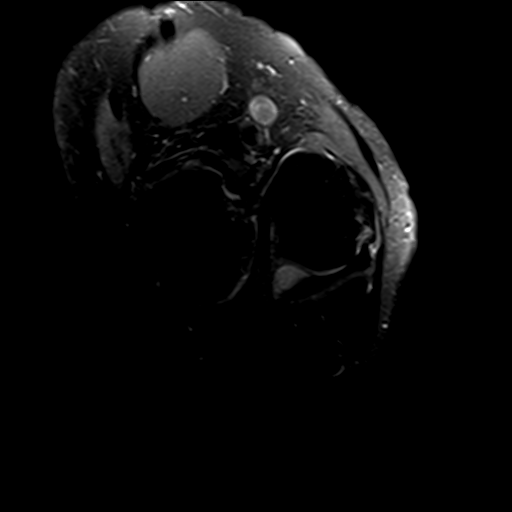
[im 11/11]
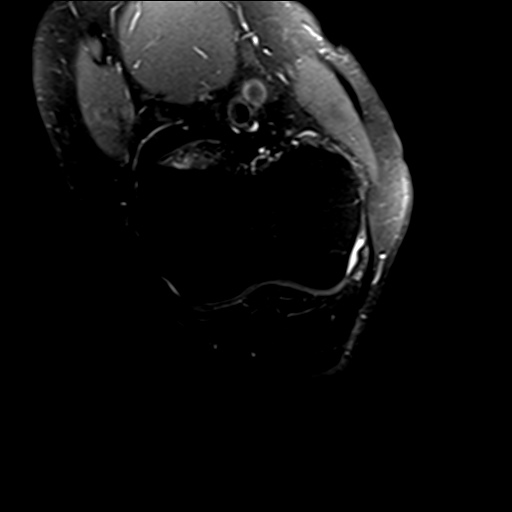

[23 of 40 positions shown; findings below may reference images not displayed]

FINDINGS: MENISCI

Medial: There is a possible nondisplaced horizontal tear of the
posterior horn of the medial meniscus (sagittal PD images 18-19 and
image 21).

Lateral: Free edge blunting of the lateral meniscal body with
intrasubstance degenerative signal. No definitive lateral meniscus
tear.

LIGAMENTS

Cruciates: ACL and PCL are intact.

Collaterals: Medial collateral ligament is intact. Lateral
collateral ligament complex is intact.

CARTILAGE

Patellofemoral:  No chondral defect.

Medial:  No chondral defect.

Lateral:  No chondral defect.

JOINT: Small joint effusion.

POPLITEAL FOSSA: Moderate size Baker's cyst.

EXTENSOR MECHANISM: Intact quadriceps tendon. Intact patellar
tendon.

BONES: No aggressive osseous lesion. No fracture or dislocation.
Minimal marginal osteophyte formation.

Other: No fluid collection or hematoma. Muscles are normal.
IMPRESSION: 1. Possible nondisplaced horizontal tear of the posterior horn of
the medial meniscus.
2. Free edge blunting of the lateral meniscal body. No definitive
lateral meniscus tear.
3. Small joint effusion.  Moderate size Baker's cyst.

## 2021-05-12 NOTE — Telephone Encounter (Signed)
Spoke to [employer] about MRI and explained findings.  Overall her symptoms have improved.  She continues to have pain in the back of the knee with heel slides and certain activities but she's not at a point that it is affecting her ability to perform ADLs to a significant degree.  Therefore, we agreed that it's best to continue with conservative management for now until her symptoms worsen.  The MRI did show a medial meniscus tear.

## 2021-05-14 ENCOUNTER — Ambulatory Visit: Payer: BC Managed Care – PPO | Admitting: Orthopaedic Surgery

## 2021-05-24 ENCOUNTER — Other Ambulatory Visit: Payer: Self-pay

## 2021-05-24 ENCOUNTER — Ambulatory Visit (INDEPENDENT_AMBULATORY_CARE_PROVIDER_SITE_OTHER): Payer: BC Managed Care – PPO | Admitting: Rheumatology

## 2021-05-24 ENCOUNTER — Encounter: Payer: Self-pay | Admitting: Rheumatology

## 2021-05-24 VITALS — BP 116/70 | HR 55 | Ht 64.0 in | Wt 112.4 lb

## 2021-05-24 DIAGNOSIS — M25562 Pain in left knee: Secondary | ICD-10-CM | POA: Diagnosis not present

## 2021-05-24 DIAGNOSIS — R5383 Other fatigue: Secondary | ICD-10-CM | POA: Diagnosis not present

## 2021-05-24 DIAGNOSIS — U071 COVID-19: Secondary | ICD-10-CM

## 2021-05-24 DIAGNOSIS — I7300 Raynaud's syndrome without gangrene: Secondary | ICD-10-CM

## 2021-05-24 DIAGNOSIS — Q6672 Congenital pes cavus, left foot: Secondary | ICD-10-CM

## 2021-05-24 DIAGNOSIS — J302 Other seasonal allergic rhinitis: Secondary | ICD-10-CM

## 2021-05-24 DIAGNOSIS — E559 Vitamin D deficiency, unspecified: Secondary | ICD-10-CM

## 2021-05-24 DIAGNOSIS — I471 Supraventricular tachycardia, unspecified: Secondary | ICD-10-CM | POA: Insufficient documentation

## 2021-05-24 DIAGNOSIS — Q6671 Congenital pes cavus, right foot: Secondary | ICD-10-CM | POA: Diagnosis not present

## 2021-05-24 DIAGNOSIS — G8929 Other chronic pain: Secondary | ICD-10-CM

## 2021-05-24 NOTE — Progress Notes (Signed)
Office Visit Note  Patient: Gloria Turner             Date of Birth: 03-Nov-1970           MRN: [id number]             PCP: [name], MD Referring: [name], MD Visit Date: 05/24/2021 Occupation: @GUAROCC @  Subjective:  Discoloration and numbness of right index finger  History of Present Illness: Gloria Turner is a 51 y.o. female seen in consultation per request of her PCP.  According the patient she has been having intermittent discoloration on her fingers since she was 51 years old.  She states that she gets discoloration over her fingers which may last from few hours to half a day.  These episodes of discoloration are not related to the environmental temperature changes.  She is also had raynaud's phenominon for the last 6 years when she is exposed to the colder temperatures.  She states that her hand fingers will turn white and sometimes blue when exposed to the colder temperatures.  Her hands and feet are mostly cold.  She states that last Saturday night she had some people over for dinner and she was mostly inside the house prepping food.  She noticed that her right index finger became painful dark and swollen.  She started experiencing numbness and tingling in her finger.  The symptoms mildly improved by the next morning.  She brought pictures on her cell phone.  The symptoms are gradually getting better but she still has discoloration and numbness in her right index finger.  She denies any history of oral ulcers, nasal ulcers, malar rash, photosensitivity, or lymphadenopathy.  She recently was seen by Dr. Erlinda Hong for left knee joint pain.  She states it was related to an injury.  She had a MRI which showed partially torn meniscus which is gradually improving.  She also had a Baker's cyst.  She still unable to flex her knee completely.  She has occasional discomfort in her shoulders and her wrist which she relates to activities.  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  She  works out on a regular basis.  She is gravida 2, para 2, miscarriages 0.  She denies any complications of pregnancy.  There is no history of preeclampsia or DVTs.  There is family history of Raynauds in her mother.  There is no known autoimmune disease in her family.  Activities of Daily Living:  Patient reports morning stiffness for 0 minutes.   Patient Denies nocturnal pain.  Difficulty dressing/grooming: Denies Difficulty climbing stairs: Denies Difficulty getting out of chair: Denies Difficulty using hands for taps, buttons, cutlery, and/or writing: Denies  Review of Systems  Constitutional:  Negative for fatigue.  HENT:  Negative for mouth sores, mouth dryness and nose dryness.   Eyes:  Negative for pain, itching and dryness.  Respiratory:  Negative for shortness of breath and difficulty breathing.   Cardiovascular:  Negative for chest pain and palpitations.  Gastrointestinal:  Negative for blood in stool, constipation and diarrhea.  Endocrine: Negative for increased urination.  Genitourinary:  Negative for difficulty urinating.  Musculoskeletal:  Positive for joint pain and joint pain. Negative for joint swelling, myalgias, morning stiffness, muscle tenderness and myalgias.  Skin:  Positive for color change. Negative for rash, redness and sensitivity to sunlight.  Allergic/Immunologic: Negative for susceptible to infections.  Neurological:  Positive for numbness. Negative for dizziness, headaches, memory loss and weakness.  Hematological:  Negative  for bruising/bleeding tendency and swollen glands.  Psychiatric/Behavioral:  Negative for depressed mood, confusion and sleep disturbance. The patient is not nervous/anxious.    PMFS History:  Patient Active Problem List   Diagnosis Date Noted   Supraventricular tachycardia ([place]) 05/24/2021   Seasonal allergies 05/24/2021    Past Medical History:  Diagnosis Date   Allergy    seasonal allergies    Family History  Problem Relation  Age of Onset   Colon polyps Father 42   Colon cancer Maternal Grandmother 55   Colon polyps Maternal Grandmother 55   Colon cancer Maternal Grandfather 55   Colon polyps Maternal Grandfather 86   Stomach cancer Paternal Grandmother 7   Healthy Son    Healthy Daughter    Esophageal cancer Neg Hx    Rectal cancer Neg Hx    Past Surgical History:  Procedure Laterality Date   CESAREAN SECTION     x 2   COLONOSCOPY WITH PROPOFOL N/A 08/18/2020   Procedure: COLONOSCOPY WITH PROPOFOL;  Surgeon: Gatha Mayer, MD;  Location: [facility];  Service: Endoscopy;  Laterality: N/A;   WISDOM TOOTH EXTRACTION     Social History   Social History Narrative   Married - 1 son 1 daughter    Husband Dr. Oren Bracket   Self-employed trainer   occ EtOH never smoker/drugs/tobacco   Immunization History  Administered Date(s) Administered   PFIZER(Purple Top)SARS-COV-2 Vaccination 07/19/2019, 08/13/2019     Objective: Vital Signs: BP 116/70 (BP Location: Right Arm, Patient Position: Sitting, Cuff Size: Normal)    Pulse (!) 55    Ht 5\' 4"  (1.626 m)    Wt 112 lb 6.4 oz (51 kg)    BMI 19.29 kg/m    Physical Exam Vitals and nursing note reviewed.  Constitutional:      Appearance: She is well-developed.  HENT:     Head: Normocephalic and atraumatic.  Eyes:     Conjunctiva/sclera: Conjunctivae normal.  Cardiovascular:     Rate and Rhythm: Normal rate and regular rhythm.     Heart sounds: Normal heart sounds.  Pulmonary:     Effort: Pulmonary effort is normal.     Breath sounds: Normal breath sounds.  Abdominal:     General: Bowel sounds are normal.     Palpations: Abdomen is soft.  Musculoskeletal:     Cervical back: Normal range of motion.  Lymphadenopathy:     Cervical: No cervical adenopathy.  Skin:    General: Skin is warm and dry.     Capillary Refill: Capillary refill takes 2 to 3 seconds.     Comments: Her hands and feet were cold to touch.  She had decreased capillary  refill.  No nailbed capillary changes, Telengectesia's or sclerodactyly was noted.  Neurological:     Mental Status: She is alert and oriented to person, place, and time.  Psychiatric:        Behavior: Behavior normal.     Musculoskeletal Exam: C-spine thoracic and lumbar spine with good range of motion.  Shoulder joints, elbow joints, wrist joints, MCPs PIPs and DIPs with good range of motion with no synovitis.  Hip joints, knee joints, ankles, MTPs and PIPs with good range of motion with no synovitis.  Bilateral pes cavus and hammertoes were noted.  Callus formation was noted under metatarsal pads.  CDAI Exam: CDAI Score: -- Patient Global: --; Provider Global: -- Swollen: --; Tender: -- Joint Exam 05/24/2021   No joint exam has been documented for this  visit   There is currently no information documented on the homunculus. Go to the Rheumatology activity and complete the homunculus joint exam.  Investigation: No additional findings.  Imaging: MR Knee Left w/o contrast  Result Date: 05/11/2021 CLINICAL DATA:  Left anterior/inferior knee pain and instability, and notes worse with flexion since 12/26/2020. EXAM: MRI OF THE LEFT KNEE WITHOUT CONTRAST TECHNIQUE: Multiplanar, multisequence MR imaging of the knee was performed. No intravenous contrast was administered. COMPARISON:  X-ray knee 03/09/2021. FINDINGS: MENISCI Medial: There is a possible nondisplaced horizontal tear of the posterior horn of the medial meniscus (sagittal PD images 18-19 and image 21). Lateral: Free edge blunting of the lateral meniscal body with intrasubstance degenerative signal. No definitive lateral meniscus tear. LIGAMENTS Cruciates: ACL and PCL are intact. Collaterals: Medial collateral ligament is intact. Lateral collateral ligament complex is intact. CARTILAGE Patellofemoral:  No chondral defect. Medial:  No chondral defect. Lateral:  No chondral defect. JOINT: Small joint effusion. POPLITEAL FOSSA: Moderate  size Baker's cyst. EXTENSOR MECHANISM: Intact quadriceps tendon. Intact patellar tendon. BONES: No aggressive osseous lesion. No fracture or dislocation. Minimal marginal osteophyte formation. Other: No fluid collection or hematoma. Muscles are normal. IMPRESSION: 1. Possible nondisplaced horizontal tear of the posterior horn of the medial meniscus. 2. Free edge blunting of the lateral meniscal body. No definitive lateral meniscus tear. 3. Small joint effusion.  Moderate size Baker's cyst. Electronically Signed   By: Maurine Simmering M.D.   On: 05/11/2021 11:38    Recent Labs: Lab Results  Component Value Date   WBC 4.6 12/11/2020   HGB 15.0 12/11/2020   PLT 232 12/11/2020   NA 137 12/11/2020   K 4.3 12/11/2020   CL 96 12/11/2020   CO2 27 12/11/2020   GLUCOSE 74 12/11/2020   BUN 15 12/11/2020   CREATININE 0.84 12/11/2020   CALCIUM 9.6 12/11/2020   December 11, 2020 CBC normal, BMP normal, TSH normal  Speciality Comments: No specialty comments available.  Procedures:  No procedures performed Allergies: Patient has no known allergies.   Assessment / Plan:     Visit Diagnoses: Raynaud's phenomenon without gangrene -she has been experiencing Raynaud's phenomenon for the last 6 to 8 years.  She developed severe episode 2 days ago with discoloration and swelling of her right index finger.  She states the discoloration lasted almost 24 hours.  She tried to wrap her hand under warm water but did not have a hand warmer for a heating pad to warm her hand.  She states her symptoms are gradually getting better although she still have some numbness in her right index finger.  Her hands were cold to touch and she had decreased capillary refill.  No sclerodactyly or telangiectasias were noted.  No nailbed capillary changes were noted.  There is no family history of autoimmune disease but her mother had severe Raynauds per patient.  She denies any history of oral ulcers, nasal ulcers, malar rash, photosensitivity,  lymphadenopathy or inflammatory arthritis.  I will obtain following labs today along with AV[facility] labs.  Keeping core temperature warm and warm clothing was discussed.  Use of hand warmer and warm was was discussed.  Plan: Urinalysis, Routine w reflex microscopic, Sedimentation rate, CK, Lupus Anticoagulant Eval w/Reflex, Cryoglobulin  Chronic pain of left knee - May 11, 2021 MRI showed moderate Baker's cyst and possible nondisplaced horizontal tear of the posterior horn of medial meniscus.  She has been followed by Dr.Xu.  She has taken meloxicam in the past which she  discontinued.  Pes cavus of both feet she has bilateral pes cavus and hammertoes.  Proper fitting shoes with orthotics were discussed.  I will also refer her to podiatrist.  Other fatigue -she has mild fatigue.  I will obtain following labs.  Plan: CK, COMPLETE METABOLIC PANEL WITH GFR  Supraventricular tachycardia (HCC)-she was evaluated by Dr. Einar Gip in the past.  No medications were needed.  Seasonal allergies  Vitamin D deficiency -she has history of vitamin D deficiency.  Plan: VITAMIN D 25 Hydroxy (Vit-D Deficiency, Fractures)  COVID-19 virus infection - 05/2020 without any complications.  Orders: Orders Placed This Encounter  Procedures   Urinalysis, Routine w reflex microscopic   Sedimentation rate   CK   Lupus Anticoagulant Eval w/Reflex   Cryoglobulin   VITAMIN D 25 Hydroxy (Vit-D Deficiency, Fractures)   COMPLETE METABOLIC PANEL WITH GFR   Ambulatory referral to Podiatry   No orders of the defined types were placed in this encounter.   Follow-Up Instructions: Return for Raynaud's phenomenon.   Bo Merino, MD  Note - This record has been created using Editor, commissioning.  Chart creation errors have been sought, but may not always  have been located. Such creation errors do not reflect on  the standard of medical care.

## 2021-05-24 NOTE — Patient Instructions (Signed)
Raynaud's Phenomenon °Raynaud's phenomenon is a condition that affects the blood vessels (arteries) that carry blood to the fingers and toes. The arteries that supply blood to the ears, lips, nipples, or the tip of the nose might also be affected. Raynaud's phenomenon causes the arteries to become narrow temporarily (spasm). As a result, the flow of blood to the affected areas is temporarily decreased. This usually occurs in response to cold temperatures or stress. During an attack, the skin in the affected areas turns white, then blue, and finally red. A person may also feel tingling or numbness in those areas. °Attacks usually last for only a brief period, and then the blood flow to the area returns to normal. In most cases, Raynaud's phenomenon does not cause serious health problems. °What are the causes? °In many cases, the cause of this condition is not known. The condition may occur on its own (primary Raynaud's phenomenon) or may be associated with other diseases or factors (secondary Raynaud's phenomenon). °Possible causes may include: °Diseases or medical conditions that damage the arteries. °Injuries and repetitive actions that hurt the hands or feet. °Being exposed to certain chemicals. °Taking medicines that narrow the arteries. °Other medical conditions, such as lupus, scleroderma, rheumatoid arthritis, thyroid problems, blood disorders, Sjogren syndrome, or atherosclerosis. °What increases the risk? °The following factors may make you more likely to develop this condition: °Being 20-40 years old. °Being female. °Having a family history of Raynaud's phenomenon. °Living in a cold climate. °Smoking. °What are the signs or symptoms? °Symptoms of this condition usually occur when you are exposed to cold temperatures or when you have emotional stress. The symptoms may last for a few minutes or up to several hours. They usually affect your fingers but may also affect your toes, nipples, lips, ears, or the tip  of your nose. Symptoms may include: °Changes in skin color. The skin in the affected areas will turn pale or white. The skin may then change from white to bluish to red as normal blood flow returns to the area. °Numbness, tingling, or pain in the affected areas. °In severe cases, symptoms may include: °Skin sores. °Tissues decaying and dying (gangrene). °How is this diagnosed? °This condition may be diagnosed based on: °Your symptoms and medical history. °A physical exam. During the exam, you may be asked to put your hands in cold water to check for a reaction to cold temperature. °Tests, such as: °Blood tests to check for other diseases or conditions. °A test to check the movement of blood through your arteries and veins (vascular ultrasound). °A test in which the skin at the base of your fingernail is examined under a microscope (nailfold capillaroscopy). °How is this treated? °During an episode, you can take actions to help symptoms go away faster. Options include moving your arms around in a windmill pattern, warming your fingers under warm water, or placing your fingers in a warm body fold, such as your armpit. °Long-term treatment for this condition often involves making lifestyle changes and taking steps to control your exposure to cold temperature. For more severe cases, medicine (calcium channel blockers) may be used to improve blood circulation. °Follow these instructions at home: °Avoiding cold temperatures °Take these steps to avoid exposure to cold: °If possible, stay indoors during cold weather. °When you go outside during cold weather, dress in layers and wear mittens, a hat, a scarf, and warm footwear. °Wear mittens or gloves when handling ice or frozen food. °Use holders for glasses or cans containing cold   drinks. °Let warm water run for a while before taking a shower or bath. °Warm up the car before driving in cold weather. °Lifestyle °If possible, avoid stressful and emotional situations. Try to  find ways to manage your stress, such as: °Exercise. °Yoga. °Meditation. °Biofeedback. °Do not use any products that contain nicotine or tobacco. These products include cigarettes, chewing tobacco, and vaping devices, such as e-cigarettes. If you need help quitting, ask your health care provider. °Avoid secondhand smoke. °Limit your use of caffeine. °Switch to decaffeinated coffee, tea, and soda. °Avoid chocolate. °Avoid vibrating tools and machinery. °General instructions °Protect your hands and feet from injuries, cuts, or bruises. °Avoid wearing tight rings or wristbands. °Wear loose fitting socks and comfortable, roomy shoes. °Take over-the-counter and prescription medicines only as told by your health care provider. °Where to find support °Raynaud's Association: www.raynauds.org °Where to find more information °National Institute of Arthritis and Musculoskeletal and Skin Diseases: www.niams.nih.gov °Contact a health care provider if: °Your discomfort becomes worse despite lifestyle changes. °You develop sores on your fingers or toes that do not heal. °You have breaks in the skin on your fingers or toes. °You have a fever. °You have pain or swelling in your joints. °You have a rash. °Your symptoms occur on only one side of your body. °Get help right away if: °Your fingers or toes turn black. °You have severe pain in the affected areas. °These symptoms may represent a serious problem that is an emergency. Do not wait to see if the symptoms will go away. Get medical help right away. Call your local emergency services (911 in the [place] Do not drive yourself to the hospital. °Summary °Raynaud's phenomenon is a condition that affects the arteries that carry blood to the fingers, toes, ears, lips, nipples, or the tip of the nose. °In many cases, the cause of this condition is not known. °Symptoms of this condition include changes in skin color along with numbness and tingling in the affected area. °Treatment for this  condition includes lifestyle changes and reducing exposure to cold temperatures. Medicines may be used for severe cases of the condition. °Contact your health care provider if your condition worsens despite treatment. °This information is not intended to replace advice given to you by your health care provider. Make sure you discuss any questions you have with your health care provider. °Document Revised: 06/30/2020 Document Reviewed: 06/30/2020 °Elsevier Patient Education © 2022 [employer] ° °

## 2021-05-25 NOTE — Progress Notes (Signed)
Vitamin D is low.  Please call in vitamin D 50,000 units once a week for 3 months.  Then she should take over-the-counter vitamin D 2000 units daily.

## 2021-05-27 ENCOUNTER — Other Ambulatory Visit: Payer: Self-pay | Admitting: *Deleted

## 2021-05-27 DIAGNOSIS — E559 Vitamin D deficiency, unspecified: Secondary | ICD-10-CM

## 2021-05-27 MED ORDER — VITAMIN D (ERGOCALCIFEROL) 1.25 MG (50000 UNIT) PO CAPS
50000.0000 [IU] | ORAL_CAPSULE | ORAL | 0 refills | Status: DC
Start: 2021-05-27 — End: 2022-09-05

## 2021-05-27 NOTE — Telephone Encounter (Signed)
-----   Message from Pollyann Savoy, MD sent at 05/25/2021  8:02 AM EST ----- Vitamin D is low.  Please call in vitamin D 50,000 units once a week for 3 months.  Then she should take over-the-counter vitamin D 2000 units daily.

## 2021-05-31 ENCOUNTER — Ambulatory Visit (HOSPITAL_COMMUNITY)
Admission: RE | Admit: 2021-05-31 | Discharge: 2021-05-31 | Disposition: A | Discharge status: Home / Self Care | Payer: BC Managed Care – PPO | Source: Ambulatory Visit | Attending: Internal Medicine | Admitting: Internal Medicine

## 2021-05-31 ENCOUNTER — Other Ambulatory Visit: Payer: Self-pay | Admitting: Internal Medicine

## 2021-05-31 DIAGNOSIS — R202 Paresthesia of skin: Secondary | ICD-10-CM

## 2021-05-31 DIAGNOSIS — E785 Hyperlipidemia, unspecified: Secondary | ICD-10-CM | POA: Diagnosis not present

## 2021-05-31 DIAGNOSIS — R200 Anesthesia of skin: Secondary | ICD-10-CM | POA: Diagnosis not present

## 2021-05-31 LAB — CRYOGLOBULIN: Cryoglobulin, Qualitative Analysis: NOT DETECTED

## 2021-05-31 LAB — LUPUS ANTICOAGULANT EVAL W/ REFLEX
PTT-LA Screen: 35 s (ref <=40)
dRVVT: 34 s (ref <=45)

## 2021-05-31 LAB — COMPLETE METABOLIC PANEL WITH GFR
AG Ratio: 1.7 (calc) (ref 1.0–2.5)
ALT: 19 U/L (ref 6–29)
AST: 21 U/L (ref 10–35)
Albumin: 4.6 g/dL (ref 3.6–5.1)
Alkaline phosphatase (APISO): 88 U/L (ref 37–153)
BUN: 19 mg/dL (ref 7–25)
CO2: 34 mmol/L — ABNORMAL HIGH (ref 20–32)
Calcium: 10.2 mg/dL (ref 8.6–10.4)
Chloride: 101 mmol/L (ref 98–110)
Creat: 0.65 mg/dL (ref 0.50–1.03)
Globulin: 2.7 g/dL (calc) (ref 1.9–3.7)
Glucose, Bld: 80 mg/dL (ref 65–99)
Potassium: 4.6 mmol/L (ref 3.5–5.3)
Sodium: 141 mmol/L (ref 135–146)
Total Bilirubin: 0.3 mg/dL (ref 0.2–1.2)
Total Protein: 7.3 g/dL (ref 6.1–8.1)
eGFR: 107 mL/min/{1.73_m2} (ref >=60)

## 2021-05-31 LAB — URINALYSIS, ROUTINE W REFLEX MICROSCOPIC
Bilirubin Urine: NEGATIVE
Glucose, UA: NEGATIVE
Hgb urine dipstick: NEGATIVE
Ketones, ur: NEGATIVE
Leukocytes,Ua: NEGATIVE
Nitrite: NEGATIVE
Protein, ur: NEGATIVE
Specific Gravity, Urine: 1.011 (ref 1.001–1.035)
pH: 6.5 (ref 5.0–8.0)

## 2021-05-31 LAB — SEDIMENTATION RATE: Sed Rate: 2 mm/h (ref 0–20)

## 2021-05-31 LAB — CK: Total CK: 87 U/L (ref 29–143)

## 2021-05-31 LAB — VITAMIN D 25 HYDROXY (VIT D DEFICIENCY, FRACTURES): Vit D, 25-Hydroxy: 28 ng/mL — ABNORMAL LOW (ref 30–100)

## 2021-05-31 IMAGING — MR MR HEAD WO/W CM
15 series · 48 of 48 positions shown · IV contrast (gadavist)
Comparison: None.

CLINICAL DATA: Right-sided numbness for a week

EXAM:
MRI HEAD WITHOUT AND WITH CONTRAST
TECHNIQUE: Multiplanar, multiecho pulse sequences of the brain and surrounding
structures were obtained without and with intravenous contrast.
CONTRAST:  5.5mL GADAVIST GADOBUTROL 1 MMOL/ML IV SOLN

[Series 5: DWI · axial · 3.0mm · 1.31mm/px · z∈[-18,+122]mm · 6 of 96 slices shown (1 of 2)]
[im 1/96]
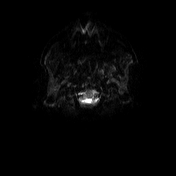
[im 20/96]
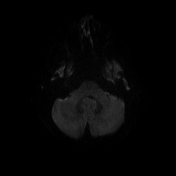
[im 39/96]
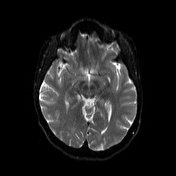
[im 58/96]
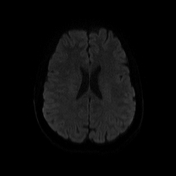
[im 77/96]
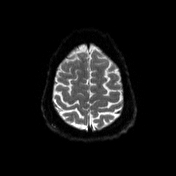
[im 96/96]
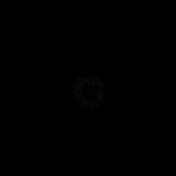

[Series 6: DWI · axial · 3.0mm · 1.31mm/px · z∈[-18,+122]mm · 3 of 48 slices shown (2 of 2)]
[im 1/48]
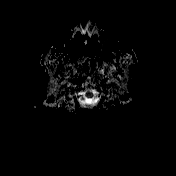
[im 24/48]
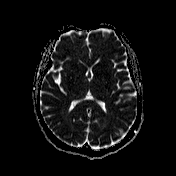
[im 48/48]
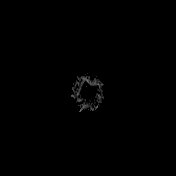

[Series 7: T1 · sagittal · 5.0mm · 0.69mm/px · 1 of 24 slices shown (1 of 4)]
[im 1/24]
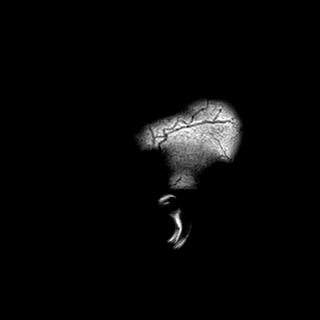

[Series 8: T2 · axial · 5.0mm · 0.60mm/px · z∈[-30,+131]mm · 2 of 26 slices shown]
[im 1/26]
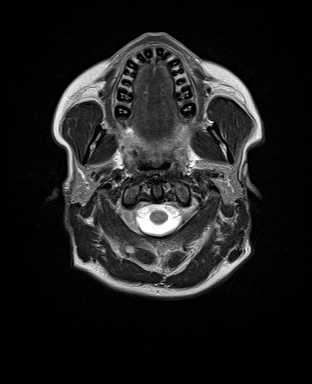
[im 26/26]
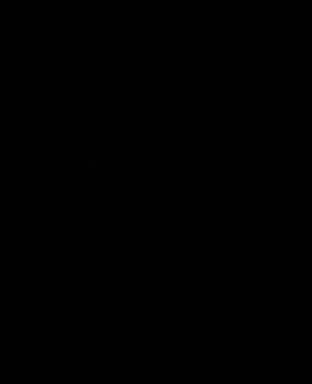

[Series 9: swi_images · axial · 3.0mm · 0.72mm/px · z∈[-25,+126]mm · 3 of 52 slices shown]
[im 1/52]
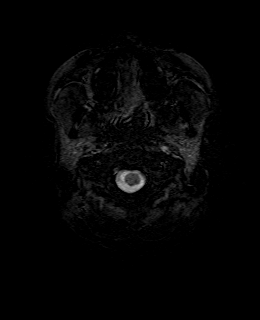
[im 26/52]
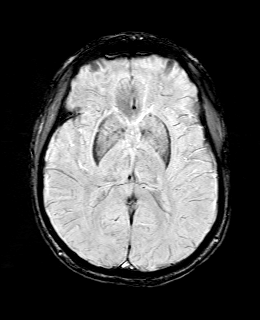
[im 52/52]
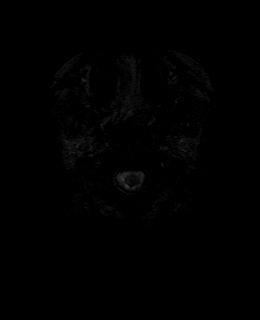

[Series 11: FLAIR · axial · 3.0mm · 0.72mm/px · z∈[-25,+126]mm · 3 of 52 slices shown]
[im 1/52]
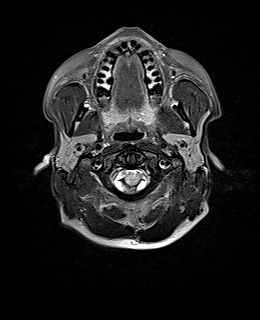
[im 26/52]
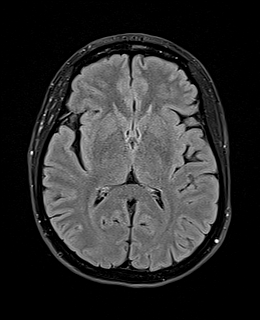
[im 52/52]
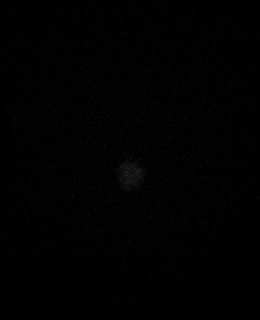

[Series 12: T1 · axial · 1.0mm · 0.90mm/px · z∈[-20,+121]mm · 8 of 144 slices shown (2 of 4)]
[im 1/144]
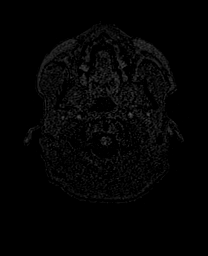
[im 21/144]
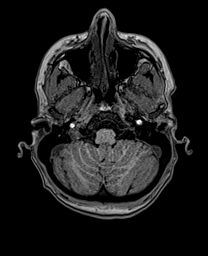
[im 41/144]
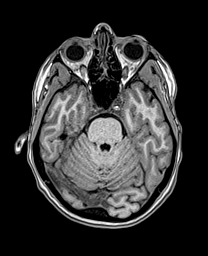
[im 62/144]
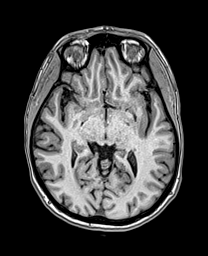
[im 82/144]
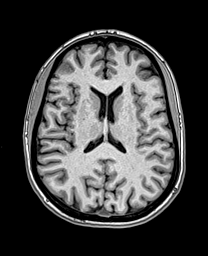
[im 103/144]
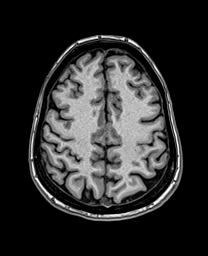
[im 123/144]
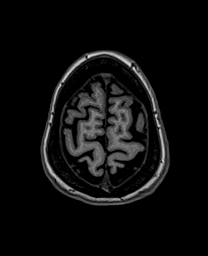
[im 144/144]
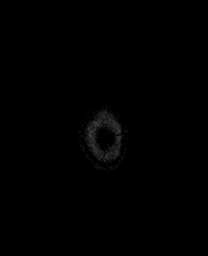

[Series 13: cor dwi_tracew · coronal · 5.0mm · 1.53mm/px · 3 of 56 slices shown]
[im 1/56]
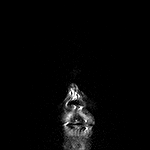
[im 28/56]
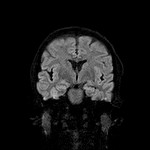
[im 56/56]
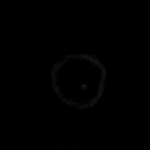

[Series 14: cor dwi_adc · coronal · 5.0mm · 1.53mm/px · 2 of 28 slices shown]
[im 1/28]
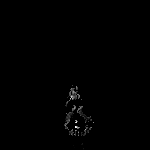
[im 28/28]
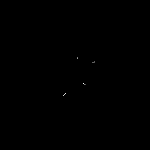

[Series 15: T2 post-contrast · coronal · 5.0mm · 0.57mm/px · 2 of 28 slices shown]
[im 1/28]
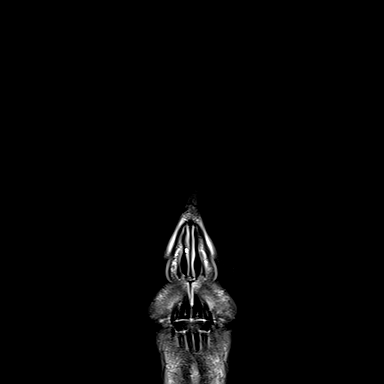
[im 28/28]
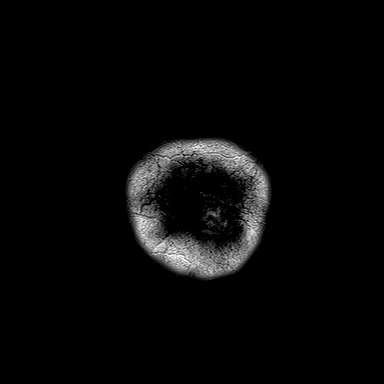

[Series 16: T1 post-contrast · axial · 1.0mm · 0.90mm/px · z∈[-20,+121]mm · 8 of 144 slices shown (1 of 3)]
[im 1/144]
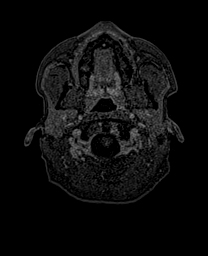
[im 21/144]
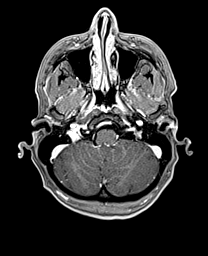
[im 41/144]
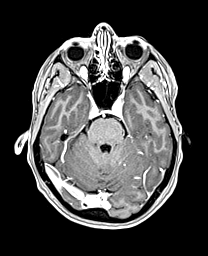
[im 62/144]
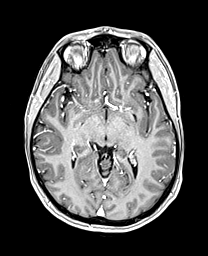
[im 82/144]
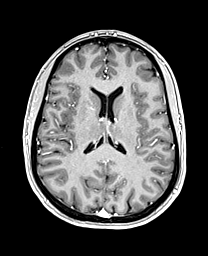
[im 103/144]
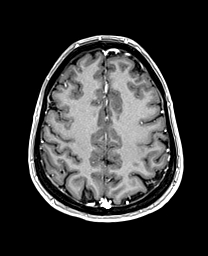
[im 123/144]
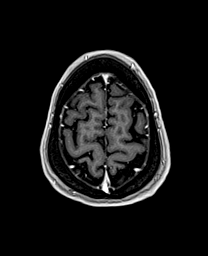
[im 144/144]
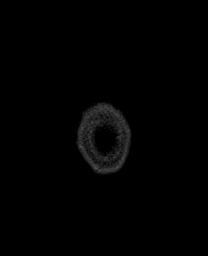

[Series 17: T1 · sagittal · 4.0mm · 0.90mm/px · 2 of 32 slices shown (3 of 4)]
[im 1/32]
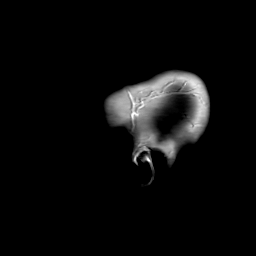
[im 32/32]
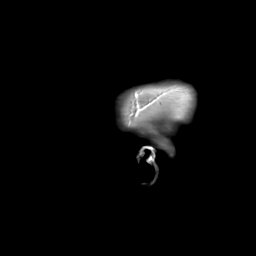

[Series 18: T1 · coronal · 4.0mm · 0.90mm/px · 2 of 40 slices shown (4 of 4)]
[im 1/40]
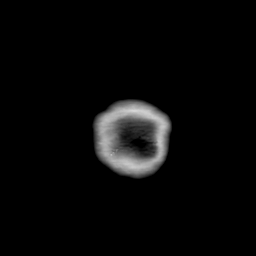
[im 40/40]
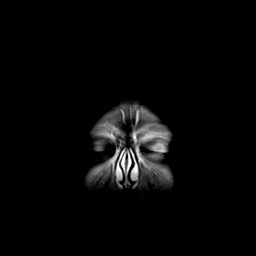

[Series 19: T1 post-contrast · coronal · 5.0mm · 0.43mm/px · 2 of 28 slices shown (2 of 3)]
[im 1/28]
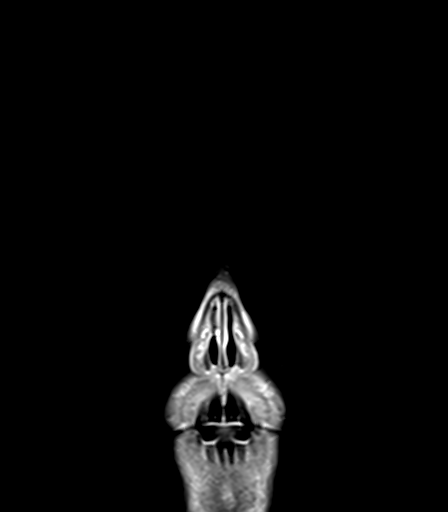
[im 28/28]
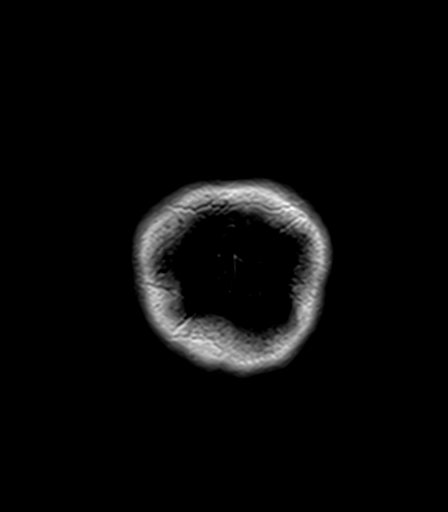

[Series 20: T1 post-contrast · sagittal · 5.0mm · 0.75mm/px · 1 of 24 slices shown (3 of 3)]
[im 1/24]
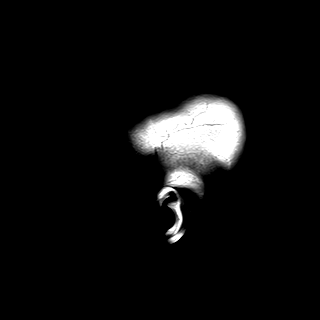

[48 of 48 positions shown; findings below may reference images not displayed]

FINDINGS: Brain: No restricted diffusion to suggest acute or subacute infarct.
No acute hemorrhage, intraparenchymal mass, mass effect, or midline
shift. No abnormal parenchymal or dural enhancement. No
hydrocephalus or extra-axial collection.

Mild enlargement of the pituitary, which measures approximately 8 x
12 x 8 mm (AP x TR x CC) (series 17, image 16 and series 18, image
26), at the upper limit of normal; on precontrast imaging, a normal
pituitary bright spot is seen. No distinct mass is identified within
the pituitary. No evidence of interference with the optic chiasm. No
abnormal parenchymal or dural enhancement.

Vascular: Normal flow voids.  Left cerebellar DVA

Skull and upper cervical spine: Normal marrow signal.

Sinuses/Orbits: Negative.

Other: Trace fluid in left mastoid air cells.
IMPRESSION: 1. Pituitary gland size at the upper limit of normal, without focal
mass, although this study is not designed to optimally evaluate the
pituitary gland. Correlate with pituitary lab values and consider
MRI pituitary protocol with and without contrast for further
evaluation if clinically indicated.
2. No other acute intracranial finding.

## 2021-05-31 MED ORDER — GADOBUTROL 1 MMOL/ML IV SOLN
5.5000 mL | Freq: Once | INTRAVENOUS | Status: AC | PRN
  Administered 2021-05-31: 5.5 mL via INTRAVENOUS

## 2021-05-31 NOTE — Progress Notes (Signed)
I discussed following lab results with the patient -cryoglobulins negative, lupus anticoagulant negative, CK, sed rate normal, UA negative,and CMP are normal.

## 2021-06-07 ENCOUNTER — Telehealth: Payer: Self-pay | Admitting: Neurology

## 2021-06-07 DIAGNOSIS — R5383 Other fatigue: Secondary | ICD-10-CM | POA: Diagnosis not present

## 2021-06-07 DIAGNOSIS — I7300 Raynaud's syndrome without gangrene: Secondary | ICD-10-CM | POA: Diagnosis not present

## 2021-06-07 DIAGNOSIS — D8989 Other specified disorders involving the immune mechanism, not elsewhere classified: Secondary | ICD-10-CM | POA: Diagnosis not present

## 2021-06-07 NOTE — Telephone Encounter (Signed)
Pt has agreed to come in on Friday the 3rd with a check in time of 11:15am. She states she will bring records with her to the appt.

## 2021-06-07 NOTE — Telephone Encounter (Signed)
Dr. Pollyann Savoy has personally asked me to see this patient asap. Can we see if she wants to come in Friday at noon? Aundra Millet, mind if I do an add on Friday at noon? Staff doesn't have to stay until she is gone, I can escort her out myself.

## 2021-06-07 NOTE — Telephone Encounter (Signed)
No problem.

## 2021-06-10 ENCOUNTER — Other Ambulatory Visit: Payer: Self-pay

## 2021-06-10 ENCOUNTER — Ambulatory Visit (INDEPENDENT_AMBULATORY_CARE_PROVIDER_SITE_OTHER): Payer: BC Managed Care – PPO | Admitting: Sports Medicine

## 2021-06-10 DIAGNOSIS — Q6671 Congenital pes cavus, right foot: Secondary | ICD-10-CM

## 2021-06-10 DIAGNOSIS — M216X9 Other acquired deformities of unspecified foot: Secondary | ICD-10-CM | POA: Diagnosis not present

## 2021-06-10 DIAGNOSIS — M2041 Other hammer toe(s) (acquired), right foot: Secondary | ICD-10-CM | POA: Diagnosis not present

## 2021-06-10 DIAGNOSIS — Z8739 Personal history of other diseases of the musculoskeletal system and connective tissue: Secondary | ICD-10-CM | POA: Diagnosis not present

## 2021-06-10 DIAGNOSIS — Q6672 Congenital pes cavus, left foot: Secondary | ICD-10-CM

## 2021-06-10 DIAGNOSIS — M2042 Other hammer toe(s) (acquired), left foot: Secondary | ICD-10-CM

## 2021-06-10 DIAGNOSIS — L84 Corns and callosities: Secondary | ICD-10-CM

## 2021-06-10 NOTE — Progress Notes (Signed)
Subjective: Gloria Turner is a 51 y.o. female patient who presents to office for evaluation of both feet.  Patient denies any current pain at this time states that she was told by her rheumatologist to come and have her feet evaluated no discomfort at this time and no history of injury but patient is a runner and was told before that she has hammertoes.  Patient denies any other pedal complaints at this time.  Patient Active Problem List   Diagnosis Date Noted   Supraventricular tachycardia ([place]) 05/24/2021   Seasonal allergies 05/24/2021    Current Outpatient Medications on File Prior to Visit  Medication Sig Dispense Refill   fluticasone (FLONASE) 50 MCG/ACT nasal spray      triamcinolone cream (KENALOG) 0.1 %      fluticasone (FLONASE) 50 MCG/ACT nasal spray Place 1 spray into both nostrils daily as needed for allergies or rhinitis (Seasonal).     meloxicam (MOBIC) 15 MG tablet Take 1 tablet (15 mg total) by mouth daily. (Patient not taking: Reported on 05/24/2021) 30 tablet 0   verapamil (CALAN) 40 MG tablet Take 1 tablet (40 mg total) by mouth 3 (three) times daily as needed. Take up to 2 pills at onset of symptoms. (Patient not taking: Reported on 03/16/2021) 30 tablet 3   Vitamin D, Ergocalciferol, (DRISDOL) 1.25 MG (50000 UNIT) CAPS capsule Take 1 capsule (50,000 Units total) by mouth every 7 (seven) days. 12 capsule 0   No current facility-administered medications on file prior to visit.    No Known Allergies  Objective:  General: Alert and oriented x3 in no acute distress  Dermatology: No open lesions bilateral lower extremities, no webspace macerations, no ecchymosis bilateral, minimal diffuse reactive keratosis plantar balls of both feet.  All nails x 10 are well manicured.  Vascular: Dorsalis Pedis and Posterior Tibial pedal pulses palpable, Capillary Fill Time 3 seconds,(+) pedal hair growth bilateral, no edema bilateral lower extremities, Temperature gradient within normal  limits.  Neurology: Johney Maine sensation intact via light touch bilateral.  Musculoskeletal: No significant reproducible tenderness bilateral.  There is significant hammertoe and plantarflexed metatarsal deformity when the foot is loaded the cavus deformity reduces and the hammertoes relax there is significant the bowstringing of the extensor tendons dorsally.  Range of motion within normal limits without pain or crepitus.  No instability noted strength within normal limits.  Assessment and Plan: Problem List Items Addressed This Visit   None Visit Diagnoses     Hammer toes of both feet    -  Primary   Prominent metatarsal head, unspecified laterality       Pes cavus of both feet       History of rheumatoid arthritis       Plantar callus            -Complete examination performed -Patient declined a set of x-rays at this visit since she is not having any pain or symptoms here for evaluation as referred by rheumatology -Discussed with patient foot type and advised her since she is very active as a runner with benefit long-term from custom functional foot orthotics that help to further offload the ball of the foot specifically underneath the first metatarsal head -Educated patient on gentle stretching and monitoring signs or symptoms related to arthritis that could eventually affect her feet because of rheumatism -Patient to return to office as scheduled or sooner if condition worsens.  Landis Martins, DPM

## 2021-06-11 ENCOUNTER — Ambulatory Visit (INDEPENDENT_AMBULATORY_CARE_PROVIDER_SITE_OTHER): Payer: BC Managed Care – PPO | Admitting: Neurology

## 2021-06-11 ENCOUNTER — Encounter: Payer: Self-pay | Admitting: Neurology

## 2021-06-11 ENCOUNTER — Telehealth: Payer: Self-pay

## 2021-06-11 VITALS — BP 142/80 | HR 64 | Ht 64.0 in | Wt 111.1 lb

## 2021-06-11 DIAGNOSIS — R29818 Other symptoms and signs involving the nervous system: Secondary | ICD-10-CM

## 2021-06-11 DIAGNOSIS — Q283 Other malformations of cerebral vessels: Secondary | ICD-10-CM

## 2021-06-11 DIAGNOSIS — G500 Trigeminal neuralgia: Secondary | ICD-10-CM

## 2021-06-11 DIAGNOSIS — R202 Paresthesia of skin: Secondary | ICD-10-CM

## 2021-06-11 DIAGNOSIS — M79601 Pain in right arm: Secondary | ICD-10-CM | POA: Diagnosis not present

## 2021-06-11 DIAGNOSIS — R200 Anesthesia of skin: Secondary | ICD-10-CM

## 2021-06-11 DIAGNOSIS — M2550 Pain in unspecified joint: Secondary | ICD-10-CM

## 2021-06-11 DIAGNOSIS — R29898 Other symptoms and signs involving the musculoskeletal system: Secondary | ICD-10-CM

## 2021-06-11 DIAGNOSIS — R292 Abnormal reflex: Secondary | ICD-10-CM

## 2021-06-11 DIAGNOSIS — I7771 Dissection of carotid artery: Secondary | ICD-10-CM

## 2021-06-11 DIAGNOSIS — R258 Other abnormal involuntary movements: Secondary | ICD-10-CM

## 2021-06-11 DIAGNOSIS — R531 Weakness: Secondary | ICD-10-CM | POA: Diagnosis not present

## 2021-06-11 DIAGNOSIS — Z820 Family history of epilepsy and other diseases of the nervous system: Secondary | ICD-10-CM

## 2021-06-11 DIAGNOSIS — W57XXXA Bitten or stung by nonvenomous insect and other nonvenomous arthropods, initial encounter: Secondary | ICD-10-CM

## 2021-06-11 DIAGNOSIS — R269 Unspecified abnormalities of gait and mobility: Secondary | ICD-10-CM

## 2021-06-11 DIAGNOSIS — G379 Demyelinating disease of central nervous system, unspecified: Secondary | ICD-10-CM

## 2021-06-11 NOTE — Patient Instructions (Addendum)
MRI brain and MRI cervical spine w/wo contrast and blood work  TIAs? Embolic phenomenon? Stroke evaluation: echocardiogram with bubble study and cardiac event monitor - will discuss with Dr. Einar Gip. Hypercoaguable disorder workup, check  imaging of the blood vessels of the neck, and also imaging of blood vessels of the head possibly with a CT Angiogram.  Additional blood work.    Transient Ischemic Attack A transient ischemic attack (TIA) causes stroke-like symptoms that go away quickly. Having a TIA means that a person is at higher risk for a stroke. A TIA happens when blood supply to the brain is blocked temporarily. A TIA is a medical emergency. What are the causes? This condition is caused by a temporary blockage in an artery in the head or neck. This means the brain does not get the blood supply it needs. There is no permanent brain damage with a TIA. A blockage can be caused by: Fatty buildup in an artery in the head or neck (atherosclerosis). A blood clot. An artery tear (dissection). Inflammation of an artery (vasculitis). Sometimes the cause is not known. What increases the risk? Certain factors may make you more likely to develop this condition. Some of these are things that you can change, such as: Obesity. Using products that contain nicotine or tobacco. Taking oral birth control, especially if you also use tobacco. Not being active. Heavy alcohol use. Drug use, especially cocaine and methamphetamine. Medical conditions that may increase your risk include: High blood pressure (hypertension). High cholesterol. Diabetes. Heart disease (coronary artery disease). An irregular heartbeat, also called atrial fibrillation (AFib). Sickle cell disease. Sleep problems (sleep apnea). Chronic inflammatory diseases, such as rheumatoid arthritis or lupus. Blood clotting disorders (hypercoagulable state). Other risk factors include: Being over the age of 39. Being female. Family history  of stroke. Previous history of blood clots, stroke, TIA, or heart attack. Having a history of preeclampsia. Migraine headache. What are the signs or symptoms? Symptoms of a TIA are the same as those of a stroke. The symptoms develop suddenly, and then go away quickly. They may include: Weakness or numbness in your face, arm, or leg, especially on one side of your body. Trouble walking or moving your arms or legs. Trouble speaking, understanding speech, or both (aphasia). Vision changes, such as double vision, blurred vision, or loss of vision. Dizziness. Confusion. Loss of balance or coordination. Nausea and vomiting. Severe headache. If possible, note what time your symptoms started. Tell your health care provider. How is this diagnosed? This condition may be diagnosed based on: Your symptoms and medical history. A physical exam. Imaging tests, usually a CT scan or MRI of the brain. Blood tests. You may also have other tests, including: Electrocardiogram (ECG). Echocardiogram. Carotid ultrasound. A scan of blood circulation in the brain (CT angiogram or MR angiogram). Continuous heart monitoring. How is this treated? The goal of treatment is to reduce the risk for a stroke. Stroke prevention therapies may include: Changes to diet and lifestyle, such as being physically active and stopping smoking. Medicines to thin the blood (antiplatelets or anticoagulants). Blood pressure medicines. Medicines to reduce cholesterol. Treating other health conditions, such as diabetes or AFib. If testing shows a narrowing in the arteries to your brain, your health care provider may recommend a procedure, such as: Carotid endarterectomy. This is done to remove the blockage from your artery. Carotid angioplasty and stenting. This uses a tube (stent) to open or widen an artery in the neck. The stent helps keep the artery  open by supporting the artery walls. Follow these instructions at  home: Medicines Take over-the-counter and prescription medicines only as told by your health care provider. If you were told to take a medicine to thin your blood, such as aspirin or an anticoagulant, take it exactly as told by your health care provider. Taking too much blood-thinning medicine can cause bleeding. Taking too little will not protect you against a stroke and other problems. Eating and drinking  Eat 5 or more servings of fruits and vegetables each day. Follow guidelines from your health care provider about your diet. You may need to follow a certain diet to help manage risk factors for stroke. This may include: Eating a low-fat, low-salt diet. Choosing high-fiber foods. Limiting carbohydrates and sugar. If you drink alcohol: Limit how much you have to: 0-1 drink a day for women who are not pregnant. 0-2 drinks a day for men. Know how much alcohol is in a drink. In the [place] one drink equals one 12 oz bottle of beer (352mL), one 5 oz glass of wine (165mL), or one 1 oz glass of hard liquor (43mL). General instructions Maintain a healthy weight. Try to get at least 30 minutes of exercise on most days. Get treatment if you have sleep apnea. Do not use any products that contain nicotine or tobacco. These products include cigarettes, chewing tobacco, and vaping devices, such as e-cigarettes. If you need help quitting, ask your health care provider. Do not use drugs. Keep all follow-up visits. This is important. Where to find more information American Stroke Association: www.stroke.org Get help right away if: You have chest pain or an irregular heartbeat. You have any symptoms of a stroke. "BE FAST" is an easy way to remember the main warning signs of a stroke. B - Balance. Signs are dizziness, sudden trouble walking, or loss of balance. E - Eyes. Signs are trouble seeing or a sudden change in vision. F - Face. Signs are sudden weakness or numbness of the face, or the face or  eyelid drooping on one side. A - Arms. Signs are weakness or numbness in an arm. This happens suddenly and usually on one side of the body. S - Speech. Signs are sudden trouble speaking, slurred speech, or trouble understanding what people say. T - Time. Time to call emergency services. Write down what time symptoms started. You have other signs of a stroke, such as: A sudden, severe headache with no known cause. Nausea or vomiting. Seizure. These symptoms may represent a serious problem that is an emergency. Do not wait to see if the symptoms will go away. Get medical help right away. Call your local emergency services (911 in the [place] Do not drive yourself to the hospital. Summary A transient ischemic attack (TIA) happens when an artery in the head or neck is blocked. The blockage clears before there is any permanent brain damage. A TIA is a medical emergency. Symptoms of a TIA are the same as those of a stroke. The symptoms develop suddenly, and then go away quickly. Having a TIA means that you are at higher risk for a stroke. The goal of treatment is to reduce your risk for a stroke. Treatment may include medicines to thin the blood and changes to diet and lifestyle. This information is not intended to replace advice given to you by your health care provider. Make sure you discuss any questions you have with your health care provider. Document Revised: 11/19/2019 Document Reviewed: 11/19/2019 Elsevier Patient Education  [place].  Multiple Sclerosis Multiple sclerosis (MS) is a disease of the brain, spinal cord, and optic nerves (central nervous system). It causes the body's disease-fighting (immune) system to destroy the protective covering (myelin sheath) around nerves in the brain. When this happens, signals (nerve impulses) going to and from the brain and spinal cord do not get sent properly or may not get sent at all. There are several types of MS: Relapsing-remitting MS.  This is the most common type. This causes sudden attacks of symptoms. After an attack, you may recover completely until the next attack, or some symptoms may remain permanently. Secondary progressive MS. This usually develops after the onset of relapsing-remitting MS. Similar to relapsing-remitting MS, this type also causes sudden attacks of symptoms. Attacks may be less frequent, but symptoms slowly get worse (progress) over time. Primary progressive MS. This causes symptoms that steadily progress over time. This type of MS does not cause sudden attacks of symptoms. The age of onset of MS varies, but it often develops between 78-35 years of age. MS is a lifelong (chronic) condition. There is no cure, but treatment can help slow down the progression of the disease. What are the causes? The cause of this condition is not known. What increases the risk? You are more likely to develop this condition if: You are a woman. You have a relative with MS. However, the condition is not passed from parent to child (inherited). You have a lack (deficiency) of vitamin D. You smoke. MS is more common in the [place] than in the [place]. What are the signs or symptoms? Relapsing-remitting and secondary progressive MS cause symptoms to occur in episodes or attacks that may last weeks to months. There may be long periods between attacks in which there are almost no symptoms. Primary progressive MS causes symptoms to steadily progress after they develop. Symptoms of MS vary because of the many different ways it affects the central nervous system. The main symptoms include: Vision problems and eye pain. Numbness and weakness. Inability to move your arms, hands, feet, or legs (paralysis). Balance problems. Shaking that you cannot control (tremors). Muscle spasms. Problems with thinking (cognitive changes). MS can also cause symptoms that are associated with the disease, but are not  always the direct result of an MS attack. They may include: Inability to control urination or bowel movements (incontinence). Headaches. Fatigue. Inability to tolerate heat. Emotional changes. Depression. Pain. How is this diagnosed? This condition is diagnosed based on: Your symptoms. A neurological exam. This involves checking central nervous system function, such as nerve function, reflexes, and coordination. MRIs of the brain and spinal cord. Lab tests, including a lumbar puncture that tests the fluid that surrounds the brain and spinal cord (cerebrospinal fluid). Tests to measure the electrical activity of the brain in response to stimulation (evoked potentials). How is this treated? There is no cure for MS, but medicines can help decrease the number and frequency of attacks and help relieve nuisance symptoms. Treatment options may include: Medicines that reduce the frequency of attacks. These medicines may be given by injection, by mouth (orally), or through an IV. Medicines that reduce inflammation (steroids). These may provide short-term relief of symptoms. Medicines to help control pain, depression, fatigue, or incontinence. Nutritional counseling. Vitamin D supplements, if you have a deficiency. Using devices to help you move around (assistive devices), such as braces, a cane, or a walker. Physical therapy to strengthen and stretch your muscles. Occupational therapy  to help you with everyday tasks. Alternative or complementary treatments such as exercise, massage, or acupuncture. Follow these instructions at home: Take over-the-counter and prescription medicines only as told by your health care provider. Do not drive or use heavy machinery while taking prescription pain medicine. Use assistive devices as recommended by your physical therapist or your health care provider. Exercise as directed by your health care provider. Eating healthy can help manage MS symptoms. Return to  your normal activities as told by your health care provider. Ask your health care provider what activities are safe for you. Reach out for support. Share your feelings with friends, family, or a support group. Keep all follow-up visits as told by your health care provider and therapists. This is important. Where to find more information National Multiple Sclerosis Society: https://www.nationalmssociety.[place] of Neurological Disorders and Stroke: http://hendricks-barton.net/ [employer]: GourmetRating.dk Contact a health care provider if: You feel depressed. You develop new pain or numbness. You have tremors. You have problems with sexual function. Get help right away if: You develop paralysis. You develop numbness. You have problems with your bladder or bowel function. You develop double vision. You lose vision in one or both eyes. You develop suicidal thoughts. You develop severe confusion. If you ever feel like you may hurt yourself or others, or have thoughts about taking your own life, get help right away. You can go to your nearest emergency department or call: Your local emergency services (911 in the [place] A suicide crisis helpline, such as the [facility] at (564)290-9714. This is open 24 hours a day. Summary Multiple sclerosis (MS) is a disease of the central nervous system that causes the body's immune system to destroy the protective covering (myelin sheath) around nerves in the brain. There are 3 types of MS: relapsing-remitting, secondary progressive, and primary progressive. Relapsing-remitting and secondary progressive MS cause symptoms to occur in episodes or attacks that may last weeks to months. Primary progressive MS causes symptoms to steadily progress after they develop. There is no cure for MS, but medicines can help decrease the number and frequency of attacks and help  relieve nuisance symptoms. Treatment may also include physical or occupational therapy. If you develop numbness, paralysis, vision problems, or other neurological symptoms, get help right away. This information is not intended to replace advice given to you by your health care provider. Make sure you discuss any questions you have with your health care provider. Document Revised: 02/04/2020 Document Reviewed: 02/04/2020 Elsevier Patient Education  2022 [employer].

## 2021-06-11 NOTE — Progress Notes (Addendum)
[facility]    Provider:  Dr Jaynee Eagles Requesting Provider: [name], MD Primary Care Provider:  [name], MD  CC:  focal neurologic deficits  02/17/2022: Repeat MRI of the brain to follow venous anomaly and focal neurologic deficits, sensory changes of the face  HPI:  Gloria Turner is a 51 y.o. female here as requested by [name], MD for acute focal neurologic symptoms. 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  She is otherwise exceptionally healthy except for a partially torn meniscus in her left knee and some mild joint pain which she relates to activities, supraventricular tachycardia evaluated by Dr. Einar Gip in the past.  She has had intermittent discoloration of her fingers since she was 51 years old, may last a few hours to half a day, not related to environmental temperatures.  However she has also had possibly Raynaud's for 10 years when she is exposed to colder temperatures historically very mild but started worsening worsening on Jan 14th 2023, started in the fingers, mother had raynaud's. She also started getting pain in the fingers, radiating up the arms, she had one finger blue and symptomatic for 2 days on the right hand. On the 18th jan 2023 she got off of the treadmill with numbness mostly right hand, no color changes, just numbness right whole hand and both feet, again no color changes, but also has tingling, and after she was off of the machine it  lasted an hour, nothing made it better or worse but eventually resolved. On the 20st jan 2023 they were having breakfast and she was having numbness in the right arm from the day before, on the 21st she had acute numbness from the right arm to the hip down to the right foot and right face. She was very concerned, went and saw Dr. Dagmar Hait, it was pins and needles on the right from  head to toe including the face. It lasted all day. They went to a game and she was walking funny, her gait was impaired with leg weakess. She was unsure of her leg, they got back home and it was hurting a little, aching. She later went on a 5-mile walk so the leg was better but  had residual right arm numbness and that has remained constant in the right hand and right arm. On the 23rd she had an MRi brain which was negative. After the mri, on the 25th she had tingling on the whole right side and prominent in the left foot. Never been on the left side other than her left foot. On the 26th the right arm symptoms which were still there became worse,  it currently feels like a tourniquet is on her biceps and continues to today. It feels as though her forearm is swollen and painful, massaging the forearm helps, also in the hand, it hurts, it improved slightly but today she has the symptoms again In the right arm biceps all the way to the palm of the hand and fingers. On the 28th they were in [employer] and she had a lightning bolt on the right behind her eye then down her face and surrounding her eye and lasted a very long time, vision fine, no headaches, no jaw pain, one lightning bolt and the numbness lasted until the 31st around the temple and she had discoloration around that area. No inciting events.  Back in July prior to any of this she had a rash "swimmer's itch" for several months but that was prior to any of what's goin gon now. No rashes, no ulcers in the mouth. She has had some stiffness in the morning but she is 50 and works out and looks great. No significant prior history. She has had some swallowing problems and coughs and hard to get down, in the past she has choked a little and had to try to get her food down. In the past 6 months she has some tingling in the morning all over her face. She had recently gotten vaccinated for covid, shingles and flu this year November and last shingles was 2 weeks ago. She has  been doing push ups, running on the treadmill. Vision fine. No headaches. Mother's first cousin and that cousin's daughter has MS. Had dense numbness on 26th. No other focal neurologic deficits, associated symptoms, inciting events or modifiable factors.  Reviewed notes, labs and imaging from outside physicians, which showed:  MRI brain, personally reviewed images and with patient:05/31/2021 1. Pituitary gland size at the upper limit of normal, without focal mass, although this study is not designed to optimally evaluate the pituitary gland. Correlate with pituitary lab values and consider MRI pituitary protocol with and without contrast for further evaluation if clinically indicated. 2. No other acute intracranial finding.  05/24/2021: Vitamin D 28 CMP unremarkable, essentially normal (co2 34 otherwise normal) Cryoglobulin negative Lupus anticoagulant not detected CK 87 nml Sed rate nml  05/27/2021: TSH  2.07 nml   I reviewed Dr. Arlean Hopping note from May 24, 2021: Patient was seen for intermittent discoloration of her fingers since she was 50 years old, may last a few hours to half a day, not related to environmental temperature changes, she is also had Raynaud's phenomena for the last 6 years when she is exposed to colder temperatures.  No oral ulcers, nasal ulcers, malar rash, photosensitivity or lymphadenopathy.  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  Her mother had Raynaud's.   Review of Systems: Patient complains of symptoms per HPI as well as the following symptoms numbness. Pertinent negatives and positives per HPI. All others negative.   Social History   Socioeconomic History   Marital status: Married    Spouse name: Gloria Turner   Number of children: 2   Years of education: Not on file   Highest education level: Bachelor's degree (e.g., BA, AB,  BS)  Occupational History   Not on file  Tobacco Use   Smoking status: Never   Smokeless tobacco: Never  Vaping Use   Vaping Use: Never used  Substance and Sexual Activity   Alcohol use: Yes    Alcohol/week: 4.0 standard drinks    Types: 4 Standard drinks or equivalent per week    Comment: occ   Drug use: Never   Sexual activity: Yes  Other Topics Concern   Not on file  Social History Narrative   Married - 1 son 1 daughter    Husband Dr. Oren Bracket   Self-employed trainer   occ EtOH never smoker/drugs/tobacco   Right handed    Social Determinants of Health   Financial Resource Strain: Not on file  Food Insecurity: Not on file  Transportation Needs: Not on file  Physical Activity: Not on file  Stress: Not on file  Social Connections:  Not on file  Intimate Partner Violence: Not on file    Family History  Problem Relation Age of Onset   Colon polyps Father 47   Colon cancer Maternal Grandmother 55   Colon polyps Maternal Grandmother 11   Colon cancer Maternal Grandfather 55   Colon polyps Maternal Grandfather 47   Stomach cancer Paternal Grandmother 37   Healthy Daughter    Healthy Son    Multiple sclerosis Maternal Aunt    Multiple sclerosis Cousin    Esophageal cancer Neg Hx    Rectal cancer Neg Hx     Past Medical History:  Diagnosis Date   Allergy    seasonal allergies    Patient Active Problem List   Diagnosis Date Noted   Left sided numbness 06/13/2021   Acute focal neurological deficit 06/13/2021   Supraventricular tachycardia ([place]) 05/24/2021   Seasonal allergies 05/24/2021    Past Surgical History:  Procedure Laterality Date   CESAREAN SECTION     x 2   COLONOSCOPY WITH PROPOFOL N/A 08/18/2020   Procedure: COLONOSCOPY WITH PROPOFOL;  Surgeon: Gatha Mayer, MD;  Location: [facility];  Service: Endoscopy;  Laterality: N/A;   WISDOM TOOTH EXTRACTION      Current Outpatient Medications  Medication Sig Dispense Refill    fluticasone (FLONASE) 50 MCG/ACT nasal spray Place 1 spray into both nostrils daily as needed for allergies or rhinitis (Seasonal).     fluticasone (FLONASE) 50 MCG/ACT nasal spray      Vitamin D, Ergocalciferol, (DRISDOL) 1.25 MG (50000 UNIT) CAPS capsule Take 1 capsule (50,000 Units total) by mouth every 7 (seven) days. 12 capsule 0   No current facility-administered medications for this visit.    Allergies as of 06/11/2021   (No Known Allergies)    Vitals: BP (!) 142/80   Pulse 64   Ht 5\' 4"  (1.626 m)   Wt 111 lb 2 oz (50.4 kg)   BMI 19.07 kg/m  Last Weight:  Wt Readings from Last 1 Encounters:  06/11/21 111 lb 2 oz (50.4 kg)   Last Height:   Ht Readings from Last 1 Encounters:  06/11/21 5\' 4"  (1.626 m)     Physical exam: Exam: Gen: NAD, conversant, well nourised, well groomed                     CV: RRR, no MRG. No Carotid Bruits. No peripheral edema, warm, nontender Eyes: Conjunctivae clear without exudates or hemorrhage  Neuro: Detailed Neurologic Exam  Speech:    Speech is normal; fluent and spontaneous with normal comprehension.  Cognition:    The patient is oriented to person, place, and time;     recent and remote memory intact;     language fluent;     normal attention, concentration,     fund of knowledge Cranial Nerves:    The pupils are equal, round, and reactive to light. The fundi are flat. Visual fields are full to finger confrontation. Extraocular movements are intact. Trigeminal sensation is intact and the muscles of mastication are normal. The face is symmetric. The palate elevates in the midline. Hearing intact. Voice is normal. Shoulder shrug is normal. The tongue has normal motion without fasciculations.   Coordination:    Normal finger to nose and heel to shin. Normal rapid alternating movements.   Gait:    Heel-toe and tandem gait are normal.   Motor Observation: Pes cavus, Hammer toes.    No asymmetry, no atrophy, and no involuntary  movements noted. Tone:    Normal muscle tone.    Posture:    Posture is normal. normal erect    Strength:    Strength is V/V in the upper and lower limbs.      Sensation: intact to LT, pin prick, vibration, negative Romberg     Reflex Exam:  DTR's:    Deep tendon reflexes in the upper and lower extremities are brisk bilaterally.   Toes:    The toes are downgoing bilaterally.   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  She is otherwise exceptionally healthy except for a partially torn meniscus in her left knee and some mild joint pain which she relates to activities, supraventricular tachycardia evaluated by Dr. Einar Gip in the past.  Patient has had a recent MRI of the brain which showed an enlarged pituitary gland per radiology report (borderline per my review) which should be further evaluated with pituitary protocol; However she continues to have new and worsening neurologic symptoms since MRI of the brain on the 23rd which is another reason MRI of the brain should be repeated - unclear if demyelinating, embolic.stroke or other etiology.  She is having very concerning symptoms, including persistent and worsening right arm numbness and paresthesias and weakness, episodic right-sided (arm and leg) dense numbness and weakness most recently with trigeminal neuralgia and right-sided facial numbness, she has had episodes of right-sided dense numbness and weakness lasting upwards of 24 hours, also left foot numbness.  Since having MRI of the brain on the 23rd, her right arm symptoms have worsened, she has had trigeminal neuralgia and numbness in the right side of face, also numbness in the left foot.  - we should repeat MRI of the brain with the pituitary protocol, also need to repeat MRI of the brain because since completing it  on the 23rd she has had worsened and new neurologic symptoms including trigeminal neuralgia, numbness of the face, worsening right arm weakness and episodes of numbness and dense right sided numbness, paresthesias and weakness (will include pituitary protocol, MS protocol and stroke protocol).  She has a family history of multiple sclerosis and given her episodic neurologic symptoms including very concerning right sided onset of dense numbness and weakness that lasted several days  with continued right arm symptoms, we should also look at her cervical spine for any MS lesions or myelopathy or otherwise.  MRI of the brain: Will add several protocols including MS protocol, pituitary protocol and stroke protocol as explained above  She had a tick, may consider Lyme test and additional blood work(reaching out to Dr. Estanislado Pandy).  Although Lyme disease is not endemic in [place], they travel the world and has had tick bites.  I will contact Deveshwar as patient states she had more blood drawn that is due next week(based on what is pending can order more labs such as p-anca,c-anca or other labs as warranted)  Other than multiple sclerosis or other demyelinating disease, other neurologic concerns include strokes/TIAs.  Given bilateral symptoms (right-sided face, arm, leg and left foot) would be more concerning for embolic phenomena.  We discussed this today and at this time we will repeat MRI of the brain and cervical spine and order CTA of the head and neck to rule out carotid dissection or large  vessel occlusion.  may continue with stroke work-up including possible hypercoagulable panel. She has seen Dr. Einar Gip in the past in cardiology and had an echocardiogram and cardiac monitoring about 6 months ago, may consider repeating also with CTA Head and Neck.    Orders Placed This Encounter  Procedures   MR BRAIN W WO CONTRAST   MR CERVICAL SPINE W WO CONTRAST   Lyme Disease Serology w/Reflex   Cc: [name], MD,  [name], MD  Sarina Ill, MD  [facility] [address] [place], [place] [address]  Phone [phone] Fax [phone]  I spent over 90 minutes of face-to-face and non-face-to-face time with patient on the  1. Right sided numbness   2. Numbness of left foot   3. Acute focal neurological deficit   4. Facial numbness   5. Right arm pain   6. Right sided weakness   7. Family history of MS (multiple sclerosis)   8. Right arm weakness   9. Right leg weakness   10. Gait abnormality   11. Trigeminal neuralgia of right side of face   12. Right facial numbness   13. Paresthesia of left foot   14. Evaluate for Demyelinating disease ([place])   15. Tick bite, unspecified site, initial encounter   16. Arthralgia, unspecified joint   17. Clonus   18. Abnormal DTR (deep tendon reflex)    diagnosis.  This included previsit chart review, lab review, study review, order entry, electronic health record documentation, patient education on the different diagnostic and therapeutic options, counseling and coordination of care, risks and benefits of management, compliance, or risk factor reduction. This includes multiple communications between myself and Dr. Shelia Media deveshwar regarding patient as well as speaking with Dr. Dagmar Hait regarding patient for over 10 minutes (he saw her over the weekend, examined her, unremarkable exam including strength, b12, folate nml) and 15 minutes examining MRI of the brain

## 2021-06-11 NOTE — Telephone Encounter (Signed)
[facility] lab results were faxed to Dr. Trevor Mace office per Dr. Corliss Skains.

## 2021-06-11 NOTE — Progress Notes (Unsigned)
Office Visit Note  Patient: Gloria Turner             Date of Birth: 13-May-1970           MRN: [id number]             PCP: [name], MD Referring: [name], MD Visit Date: 06/22/2021 Occupation: @GUAROCC @  Subjective:  No chief complaint on file.   History of Present Illness: Gloria Turner is a 51 y.o. female ***   Activities of Daily Living:  Patient reports morning stiffness for *** {minute/hour:19697}.   Patient {ACTIONS;DENIES/REPORTS:21021675::"Denies"} nocturnal pain.  Difficulty dressing/grooming: {ACTIONS;DENIES/REPORTS:21021675::"Denies"} Difficulty climbing stairs: {ACTIONS;DENIES/REPORTS:21021675::"Denies"} Difficulty getting out of chair: {ACTIONS;DENIES/REPORTS:21021675::"Denies"} Difficulty using hands for taps, buttons, cutlery, and/or writing: {ACTIONS;DENIES/REPORTS:21021675::"Denies"}  No Rheumatology ROS completed.   PMFS History:  Patient Active Problem List   Diagnosis Date Noted   Supraventricular tachycardia ([place]) 05/24/2021   Seasonal allergies 05/24/2021    Past Medical History:  Diagnosis Date   Allergy    seasonal allergies    Family History  Problem Relation Age of Onset   Colon polyps Father 27   Colon cancer Maternal Grandmother 55   Colon polyps Maternal Grandmother 55   Colon cancer Maternal Grandfather 55   Colon polyps Maternal Grandfather 81   Stomach cancer Paternal Grandmother 38   Healthy Son    Healthy Daughter    Esophageal cancer Neg Hx    Rectal cancer Neg Hx    Past Surgical History:  Procedure Laterality Date   CESAREAN SECTION     x 2   COLONOSCOPY WITH PROPOFOL N/A 08/18/2020   Procedure: COLONOSCOPY WITH PROPOFOL;  Surgeon: Gatha Mayer, MD;  Location: [facility];  Service: Endoscopy;  Laterality: N/A;   WISDOM TOOTH EXTRACTION     Social History   Social History Narrative   Married - 1 son 1 daughter    Husband Dr. Oren Bracket   Self-employed trainer   occ EtOH never  smoker/drugs/tobacco   Immunization History  Administered Date(s) Administered   PFIZER(Purple Top)SARS-COV-2 Vaccination 07/19/2019, 08/13/2019     Objective: Vital Signs: There were no vitals taken for this visit.   Physical Exam   Musculoskeletal Exam: ***  CDAI Exam: CDAI Score: -- Patient Global: --; Provider Global: -- Swollen: --; Tender: -- Joint Exam 06/22/2021   No joint exam has been documented for this visit   There is currently no information documented on the homunculus. Go to the Rheumatology activity and complete the homunculus joint exam.  Investigation: No additional findings.  Imaging: MR BRAIN W WO CONTRAST  Result Date: 05/31/2021 CLINICAL DATA:  Right-sided numbness for a week EXAM: MRI HEAD WITHOUT AND WITH CONTRAST TECHNIQUE: Multiplanar, multiecho pulse sequences of the brain and surrounding structures were obtained without and with intravenous contrast. CONTRAST:  5.52mL GADAVIST GADOBUTROL 1 MMOL/ML IV SOLN COMPARISON:  None. FINDINGS: Brain: No restricted diffusion to suggest acute or subacute infarct. No acute hemorrhage, intraparenchymal mass, mass effect, or midline shift. No abnormal parenchymal or dural enhancement. No hydrocephalus or extra-axial collection. Mild enlargement of the pituitary, which measures approximately 8 x 12 x 8 mm (AP x TR x CC) (series 17, image 16 and series 18, image 26), at the upper limit of normal; on precontrast imaging, a normal pituitary bright spot is seen. No distinct mass is identified within the pituitary. No evidence of interference with the optic chiasm. No abnormal parenchymal or dural enhancement. Vascular: Normal flow voids.  Left cerebellar DVA Skull and  upper cervical spine: Normal marrow signal. Sinuses/Orbits: Negative. Other: Trace fluid in left mastoid air cells. IMPRESSION: 1. Pituitary gland size at the upper limit of normal, without focal mass, although this study is not designed to optimally evaluate the  pituitary gland. Correlate with pituitary lab values and consider MRI pituitary protocol with and without contrast for further evaluation if clinically indicated. 2. No other acute intracranial finding. Electronically Signed   By: Merilyn Baba M.D.   On: 05/31/2021 20:51    Recent Labs: Lab Results  Component Value Date   WBC 4.6 12/11/2020   HGB 15.0 12/11/2020   PLT 232 12/11/2020   NA 141 05/24/2021   K 4.6 05/24/2021   CL 101 05/24/2021   CO2 34 (H) 05/24/2021   GLUCOSE 80 05/24/2021   BUN 19 05/24/2021   CREATININE 0.65 05/24/2021   BILITOT 0.3 05/24/2021   AST 21 05/24/2021   ALT 19 05/24/2021   PROT 7.3 05/24/2021   CALCIUM 10.2 05/24/2021   May 24, 2021 UA negative, lupus anticoagulant negative, CK 87, cryoglobulin not detected, vitamin D 28   Speciality Comments: No specialty comments available.  Procedures:  No procedures performed Allergies: Patient has no known allergies.   Assessment / Plan:     Visit Diagnoses: No diagnosis found.  Orders: No orders of the defined types were placed in this encounter.  No orders of the defined types were placed in this encounter.   Face-to-face time spent with patient was *** minutes. Greater than 50% of time was spent in counseling and coordination of care.  Follow-Up Instructions: No follow-ups on file.   Bo Merino, MD  Note - This record has been created using Editor, commissioning.  Chart creation errors have been sought, but may not always  have been located. Such creation errors do not reflect on  the standard of medical care.

## 2021-06-13 ENCOUNTER — Encounter: Payer: Self-pay | Admitting: Neurology

## 2021-06-13 DIAGNOSIS — R200 Anesthesia of skin: Secondary | ICD-10-CM | POA: Insufficient documentation

## 2021-06-13 DIAGNOSIS — R29818 Other symptoms and signs involving the nervous system: Secondary | ICD-10-CM | POA: Insufficient documentation

## 2021-06-14 ENCOUNTER — Encounter: Payer: Self-pay | Admitting: Neurology

## 2021-06-14 ENCOUNTER — Other Ambulatory Visit (INDEPENDENT_AMBULATORY_CARE_PROVIDER_SITE_OTHER): Payer: Self-pay

## 2021-06-14 DIAGNOSIS — M2550 Pain in unspecified joint: Secondary | ICD-10-CM

## 2021-06-14 DIAGNOSIS — Z0289 Encounter for other administrative examinations: Secondary | ICD-10-CM

## 2021-06-14 DIAGNOSIS — W57XXXA Bitten or stung by nonvenomous insect and other nonvenomous arthropods, initial encounter: Secondary | ICD-10-CM | POA: Diagnosis not present

## 2021-06-15 ENCOUNTER — Telehealth: Payer: Self-pay | Admitting: Rheumatology

## 2021-06-15 LAB — LYME DISEASE SEROLOGY W/REFLEX: Lyme Total Antibody EIA: NEGATIVE

## 2021-06-15 NOTE — Telephone Encounter (Signed)
[facility] labs showed positive ANA.  All other serology including ANCA was negative.  I discussed lab results with the patient.  I also forwarded lab results to Dr. Jaynee Eagles.  Please cancel her follow-up visit in February.  She would like to reschedule a follow-up visit in 6 months for Raynauds and positive ANA. Thank you, Bo Merino, MD

## 2021-06-15 NOTE — Progress Notes (Signed)
Thank you for the update!

## 2021-06-16 ENCOUNTER — Telehealth: Payer: Self-pay | Admitting: Neurology

## 2021-06-16 NOTE — Telephone Encounter (Signed)
It looks like the patient had a MRI Brain w/wo contrast by Dr. Felipa Eth on 05/31/21. I'm not going to be able another approved so soon.

## 2021-06-16 NOTE — Telephone Encounter (Signed)
Okay perfect that is all I needed to know.  MR Brain w/wo contrast & MR Cervical spine w/wo contrast Dr. Valaria Good Berkley Harvey: [phone] (exp. 06/16/21 to 07/15/21)  Patient is scheduled at [facility] for 06/22/21.

## 2021-06-22 ENCOUNTER — Other Ambulatory Visit: Payer: BC Managed Care – PPO

## 2021-06-22 ENCOUNTER — Ambulatory Visit: Payer: BC Managed Care – PPO | Admitting: Rheumatology

## 2021-06-22 ENCOUNTER — Ambulatory Visit (INDEPENDENT_AMBULATORY_CARE_PROVIDER_SITE_OTHER): Payer: BC Managed Care – PPO

## 2021-06-22 DIAGNOSIS — G500 Trigeminal neuralgia: Secondary | ICD-10-CM | POA: Diagnosis not present

## 2021-06-22 DIAGNOSIS — G8929 Other chronic pain: Secondary | ICD-10-CM

## 2021-06-22 DIAGNOSIS — R202 Paresthesia of skin: Secondary | ICD-10-CM | POA: Diagnosis not present

## 2021-06-22 DIAGNOSIS — Q6671 Congenital pes cavus, right foot: Secondary | ICD-10-CM

## 2021-06-22 DIAGNOSIS — J302 Other seasonal allergic rhinitis: Secondary | ICD-10-CM

## 2021-06-22 DIAGNOSIS — R200 Anesthesia of skin: Secondary | ICD-10-CM | POA: Diagnosis not present

## 2021-06-22 DIAGNOSIS — M79601 Pain in right arm: Secondary | ICD-10-CM

## 2021-06-22 DIAGNOSIS — R292 Abnormal reflex: Secondary | ICD-10-CM

## 2021-06-22 DIAGNOSIS — R29818 Other symptoms and signs involving the nervous system: Secondary | ICD-10-CM

## 2021-06-22 DIAGNOSIS — R269 Unspecified abnormalities of gait and mobility: Secondary | ICD-10-CM

## 2021-06-22 DIAGNOSIS — R29898 Other symptoms and signs involving the musculoskeletal system: Secondary | ICD-10-CM

## 2021-06-22 DIAGNOSIS — E559 Vitamin D deficiency, unspecified: Secondary | ICD-10-CM

## 2021-06-22 DIAGNOSIS — R258 Other abnormal involuntary movements: Secondary | ICD-10-CM

## 2021-06-22 DIAGNOSIS — Z820 Family history of epilepsy and other diseases of the nervous system: Secondary | ICD-10-CM

## 2021-06-22 DIAGNOSIS — R531 Weakness: Secondary | ICD-10-CM | POA: Diagnosis not present

## 2021-06-22 DIAGNOSIS — I471 Supraventricular tachycardia: Secondary | ICD-10-CM

## 2021-06-22 DIAGNOSIS — I7300 Raynaud's syndrome without gangrene: Secondary | ICD-10-CM

## 2021-06-22 DIAGNOSIS — G379 Demyelinating disease of central nervous system, unspecified: Secondary | ICD-10-CM

## 2021-06-22 MED ORDER — GADOBENATE DIMEGLUMINE 529 MG/ML IV SOLN
10.0000 mL | Freq: Once | INTRAVENOUS | Status: AC | PRN
  Administered 2021-06-22: 10 mL via INTRAVENOUS

## 2021-06-23 ENCOUNTER — Ambulatory Visit (INDEPENDENT_AMBULATORY_CARE_PROVIDER_SITE_OTHER): Payer: BC Managed Care – PPO

## 2021-06-23 ENCOUNTER — Other Ambulatory Visit: Payer: Self-pay

## 2021-06-23 DIAGNOSIS — M216X2 Other acquired deformities of left foot: Secondary | ICD-10-CM | POA: Diagnosis not present

## 2021-06-23 DIAGNOSIS — M2041 Other hammer toe(s) (acquired), right foot: Secondary | ICD-10-CM

## 2021-06-23 DIAGNOSIS — M216X1 Other acquired deformities of right foot: Secondary | ICD-10-CM

## 2021-06-23 DIAGNOSIS — Q6671 Congenital pes cavus, right foot: Secondary | ICD-10-CM

## 2021-06-23 DIAGNOSIS — Q6672 Congenital pes cavus, left foot: Secondary | ICD-10-CM

## 2021-06-23 DIAGNOSIS — M2042 Other hammer toe(s) (acquired), left foot: Secondary | ICD-10-CM

## 2021-06-23 DIAGNOSIS — M216X9 Other acquired deformities of unspecified foot: Secondary | ICD-10-CM | POA: Diagnosis not present

## 2021-06-23 NOTE — Progress Notes (Signed)
SITUATION Reason for Consult: Evaluation for Bilateral Custom Foot Orthoses Patient / Caregiver Report: Patient is ready for foot orthotics  OBJECTIVE DATA: Patient History / Diagnosis:    ICD-10-CM   1. Hammer toes of both feet  M20.41    M20.42     2. Prominent metatarsal head, unspecified laterality  M21.6X9     3. Pes cavus of both feet  Q66.71    Q66.72       Current or Previous Devices: None and no history  Foot Examination: Skin presentation:   Intact Ulcers & Callousing:   None and no history Toe / Foot Deformities:  Pes cavus Weight Bearing Presentation:  cavus Sensation:    Intact  Shoe size: 9M  ORTHOTIC RECOMMENDATION Recommended Device: 1x pair of custom functional foot orthotics  GOALS OF ORTHOSES - Reduce Pain - Prevent Foot Deformity - Prevent Progression of Further Foot Deformity - Relieve Pressure - Improve the Overall Biomechanical Function of the Foot and Lower Extremity.  ACTIONS PERFORMED Patient was casted for Foot Orthoses via crush box. Procedure was explained and patient tolerated procedure well. All questions were answered and concerns addressed.  PLAN Potential out of pocket cost was communicated to patient. Casts are to be sent to [facility] for fabrication. Patient is to be called for fitting when devices are ready.

## 2021-06-24 DIAGNOSIS — H5711 Ocular pain, right eye: Secondary | ICD-10-CM | POA: Diagnosis not present

## 2021-06-24 DIAGNOSIS — H0102A Squamous blepharitis right eye, upper and lower eyelids: Secondary | ICD-10-CM | POA: Diagnosis not present

## 2021-06-24 DIAGNOSIS — H04123 Dry eye syndrome of bilateral lacrimal glands: Secondary | ICD-10-CM | POA: Diagnosis not present

## 2021-06-24 DIAGNOSIS — G43809 Other migraine, not intractable, without status migrainosus: Secondary | ICD-10-CM | POA: Diagnosis not present

## 2021-06-27 ENCOUNTER — Encounter: Payer: Self-pay | Admitting: Neurology

## 2021-06-29 ENCOUNTER — Telehealth: Payer: Self-pay | Admitting: Rheumatology

## 2021-06-29 ENCOUNTER — Encounter: Payer: Self-pay | Admitting: *Deleted

## 2021-06-29 NOTE — Addendum Note (Signed)
Addended by: Naomie Dean B on: 06/29/2021 05:15 PM   Modules accepted: Orders

## 2021-06-29 NOTE — Telephone Encounter (Signed)
Gloria Turner was initially seen by me for raynaud's phenomenon.  At the time serology was negative except for positive ANA.  Since then she has developed neuralgias was involving upper and lower extremities and paresthesias on her face.  I referred her to neurology for further evaluation.  She was evaluated by Dr. Lucia Gaskins.  She had extensive work-up including MRI of her brain, and MRI of her cervical spine.  MRI of the cervical spine showed 2 mm of cerebellar ectopia, mild spinal stenosis and multilevel spondylosis.  Her repeat MRI of the brain showed pituitary gland at the upper limits of the normal in size.  Due to ongoing symptoms she will be undergoing further work-up by Dr. Lucia Gaskins including EMG and nerve conduction velocity.  I received a phone call from [facility] yesterday.  She stated that due to discomfort and ongoing work-up she does not have the time for extra modules for the re-certification.  She has completed all the necessary requirements for her re-certification.  The stress level has exacerbated her symptoms.  To avoid further stress I would write a letter of excuse for her.  Pollyann Savoy, MD

## 2021-06-30 ENCOUNTER — Encounter: Payer: Self-pay | Admitting: Neurology

## 2021-06-30 ENCOUNTER — Telehealth: Payer: Self-pay | Admitting: Neurology

## 2021-06-30 NOTE — Telephone Encounter (Signed)
[employer] Berkley Harvey: [id number] (exp. 06/30/21 to 07/29/21) order sent to GI, they will reach out to the patient to schedule.

## 2021-07-18 ENCOUNTER — Encounter: Payer: Self-pay | Admitting: Neurology

## 2021-07-22 ENCOUNTER — Other Ambulatory Visit: Payer: BC Managed Care – PPO

## 2021-07-22 ENCOUNTER — Inpatient Hospital Stay: Admission: RE | Admit: 2021-07-22 | Payer: BC Managed Care – PPO | Source: Ambulatory Visit

## 2021-07-23 ENCOUNTER — Telehealth: Payer: Self-pay | Admitting: Sports Medicine

## 2021-07-23 NOTE — Telephone Encounter (Signed)
Lvm for patient to call back and schedule an appointment to pick up orthotics  ?

## 2021-08-02 ENCOUNTER — Ambulatory Visit: Payer: BC Managed Care – PPO

## 2021-08-02 ENCOUNTER — Other Ambulatory Visit: Payer: Self-pay

## 2021-08-02 DIAGNOSIS — M2041 Other hammer toe(s) (acquired), right foot: Secondary | ICD-10-CM

## 2021-08-02 DIAGNOSIS — M216X9 Other acquired deformities of unspecified foot: Secondary | ICD-10-CM

## 2021-08-02 DIAGNOSIS — Q6672 Congenital pes cavus, left foot: Secondary | ICD-10-CM

## 2021-08-02 NOTE — Progress Notes (Signed)
SITUATION: ?Reason for Visit: Fitting and Delivery of Custom Fabricated Foot Orthoses ?Patient Report: Patient reports comfort and is satisfied with device. ? ?OBJECTIVE DATA: ?Patient History / Diagnosis:   ?  ICD-10-CM   ?1. Pes cavus of both feet  Q66.71   ? Q66.72   ?  ?2. Hammer toes of both feet  M20.41   ? M20.42   ?  ?3. Prominent metatarsal head, unspecified laterality  M21.6X9   ?  ? ? ?Provided Device:  Custom Functional Foot Orthotics ?    [employer]: [id number] ? ?GOAL OF ORTHOSIS ?- Improve gait ?- Decrease energy expenditure ?- Improve Balance ?- Provide Triplanar stability of foot complex ?- Facilitate motion ? ?ACTIONS PERFORMED ?Patient was fit with foot orthotics trimmed to shoe last. Patient tolerated fittign procedure.  ? ?Patient was provided with verbal and written instruction and demonstration regarding donning, doffing, wear, care, proper fit, function, purpose, cleaning, and use of the orthosis and in all related precautions and risks and benefits regarding the orthosis. ? ?Patient was also provided with verbal instruction regarding how to report any failures or malfunctions of the orthosis and necessary follow up care. Patient was also instructed to contact our office regarding any change in status that may affect the function of the orthosis. ? ?Patient demonstrated independence with proper donning, doffing, and fit and verbalized understanding of all instructions. ? ?PLAN: ?Patient is to follow up in one week or as necessary (PRN). All questions were answered and concerns addressed. Plan of care was discussed with and agreed upon by the patient. ? ?

## 2021-08-26 ENCOUNTER — Other Ambulatory Visit: Payer: Self-pay | Admitting: Rheumatology

## 2021-08-26 DIAGNOSIS — E559 Vitamin D deficiency, unspecified: Secondary | ICD-10-CM

## 2021-11-22 DIAGNOSIS — Z681 Body mass index (BMI) 19 or less, adult: Secondary | ICD-10-CM | POA: Diagnosis not present

## 2021-11-22 DIAGNOSIS — Z01419 Encounter for gynecological examination (general) (routine) without abnormal findings: Secondary | ICD-10-CM | POA: Diagnosis not present

## 2021-11-22 DIAGNOSIS — Z1231 Encounter for screening mammogram for malignant neoplasm of breast: Secondary | ICD-10-CM | POA: Diagnosis not present

## 2021-12-12 NOTE — Progress Notes (Deleted)
Office Visit Note  Patient: Gloria Turner             Date of Birth: August 19, 1970           MRN: [id number]             PCP: Chilton Greathouse, MD Referring: Chilton Greathouse, MD Visit Date: 12/23/2021 Occupation: @GUAROCC @  Subjective:  No chief complaint on file.   History of Present Illness: Gloria Turner is a 51 y.o. female ***   Activities of Daily Living:  Patient reports morning stiffness for *** {minute/hour:19697}.   Patient {ACTIONS;DENIES/REPORTS:21021675::"Denies"} nocturnal pain.  Difficulty dressing/grooming: {ACTIONS;DENIES/REPORTS:21021675::"Denies"} Difficulty climbing stairs: {ACTIONS;DENIES/REPORTS:21021675::"Denies"} Difficulty getting out of chair: {ACTIONS;DENIES/REPORTS:21021675::"Denies"} Difficulty using hands for taps, buttons, cutlery, and/or writing: {ACTIONS;DENIES/REPORTS:21021675::"Denies"}  No Rheumatology ROS completed.   PMFS History:  Patient Active Problem List   Diagnosis Date Noted   Left sided numbness 06/13/2021   Acute focal neurological deficit 06/13/2021   Supraventricular tachycardia (HCC) 05/24/2021   Seasonal allergies 05/24/2021    Past Medical History:  Diagnosis Date   Allergy    seasonal allergies    Family History  Problem Relation Age of Onset   Colon polyps Father 84   Colon cancer Maternal Grandmother 55   Colon polyps Maternal Grandmother 55   Colon cancer Maternal Grandfather 55   Colon polyps Maternal Grandfather 54   Stomach cancer Paternal Grandmother 47   Healthy Daughter    Healthy Son    Multiple sclerosis Maternal Aunt    Multiple sclerosis Cousin    Esophageal cancer Neg Hx    Rectal cancer Neg Hx    Past Surgical History:  Procedure Laterality Date   CESAREAN SECTION     x 2   COLONOSCOPY WITH PROPOFOL N/A 08/18/2020   Procedure: COLONOSCOPY WITH PROPOFOL;  Surgeon: 10/18/2020, MD;  Location: [facility];  Service: Endoscopy;  Laterality: N/A;   WISDOM TOOTH EXTRACTION     Social  History   Social History Narrative   Married - 1 son 1 daughter    Husband Dr. Iva Boop   Self-employed trainer   occ EtOH never smoker/drugs/tobacco   Right handed    Immunization History  Administered Date(s) Administered   PFIZER(Purple Top)SARS-COV-2 Vaccination 07/19/2019, 08/13/2019     Objective: Vital Signs: There were no vitals taken for this visit.   Physical Exam   Musculoskeletal Exam: ***  CDAI Exam: CDAI Score: -- Patient Global: --; Provider Global: -- Swollen: --; Tender: -- Joint Exam 12/23/2021   No joint exam has been documented for this visit   There is currently no information documented on the homunculus. Go to the Rheumatology activity and complete the homunculus joint exam.  Investigation: No additional findings.  Imaging: No results found.  Recent Labs: Lab Results  Component Value Date   WBC 4.6 12/11/2020   HGB 15.0 12/11/2020   PLT 232 12/11/2020   NA 141 05/24/2021   K 4.6 05/24/2021   CL 101 05/24/2021   CO2 34 (H) 05/24/2021   GLUCOSE 80 05/24/2021   BUN 19 05/24/2021   CREATININE 0.65 05/24/2021   BILITOT 0.3 05/24/2021   AST 21 05/24/2021   ALT 19 05/24/2021   PROT 7.3 05/24/2021   CALCIUM 10.2 05/24/2021  June 08, 2021 ANA 1: 320 NS ENA neg  Speciality Comments: No specialty comments available.  Procedures:  No procedures performed Allergies: Patient has no known allergies.   Assessment / Plan:     Visit Diagnoses: Raynaud's phenomenon without  gangrene  Positive ANA (antinuclear antibody)  Chronic pain of left knee  Pes cavus of both feet  Other fatigue  Supraventricular tachycardia (HCC)  Seasonal allergies  Vitamin D deficiency  Orders: No orders of the defined types were placed in this encounter.  No orders of the defined types were placed in this encounter.   Face-to-face time spent with patient was *** minutes. Greater than 50% of time was spent in counseling and coordination of  care.  Follow-Up Instructions: No follow-ups on file.   Pollyann Savoy, MD  Note - This record has been created using Animal nutritionist.  Chart creation errors have been sought, but may not always  have been located. Such creation errors do not reflect on  the standard of medical care.

## 2021-12-23 ENCOUNTER — Ambulatory Visit: Payer: BC Managed Care – PPO | Admitting: Rheumatology

## 2021-12-23 DIAGNOSIS — E559 Vitamin D deficiency, unspecified: Secondary | ICD-10-CM

## 2021-12-23 DIAGNOSIS — Q6671 Congenital pes cavus, right foot: Secondary | ICD-10-CM

## 2021-12-23 DIAGNOSIS — J302 Other seasonal allergic rhinitis: Secondary | ICD-10-CM

## 2021-12-23 DIAGNOSIS — G8929 Other chronic pain: Secondary | ICD-10-CM

## 2021-12-23 DIAGNOSIS — I7300 Raynaud's syndrome without gangrene: Secondary | ICD-10-CM

## 2021-12-23 DIAGNOSIS — I471 Supraventricular tachycardia: Secondary | ICD-10-CM

## 2021-12-23 DIAGNOSIS — R768 Other specified abnormal immunological findings in serum: Secondary | ICD-10-CM

## 2021-12-23 DIAGNOSIS — R5383 Other fatigue: Secondary | ICD-10-CM

## 2022-02-13 ENCOUNTER — Encounter: Payer: Self-pay | Admitting: Neurology

## 2022-02-16 ENCOUNTER — Telehealth: Payer: Self-pay | Admitting: Rheumatology

## 2022-02-16 NOTE — Telephone Encounter (Signed)
-----   Message from Carole Binning, LPN sent at 46/28/6381  8:26 AM EDT ----- Please contact patient to schedule follow up visit. Please put a note in appointment note that patient has [facility] labs in folder at [facility]. Thanks!

## 2022-02-16 NOTE — Telephone Encounter (Signed)
Patient declined scheduling.  Patient states she already reviewed her [facility] labwork results and doesn't need a follow-up appointment.

## 2022-02-17 ENCOUNTER — Telehealth: Payer: Self-pay | Admitting: Neurology

## 2022-02-17 NOTE — Telephone Encounter (Addendum)
Pt scheduled for MR brain w/wo contrast at [facility] for 03/01/22 at 2:00 pm.  Lorella Nimrod auth: [id number] (02/17/22-03/18/22)

## 2022-02-17 NOTE — Addendum Note (Signed)
Addended by: Sarina Ill B on: 02/17/2022 01:15 PM   Modules accepted: Orders

## 2022-02-21 ENCOUNTER — Telehealth: Payer: Self-pay | Admitting: Neurology

## 2022-03-01 ENCOUNTER — Ambulatory Visit (INDEPENDENT_AMBULATORY_CARE_PROVIDER_SITE_OTHER): Payer: BC Managed Care – PPO

## 2022-03-01 DIAGNOSIS — R29898 Other symptoms and signs involving the musculoskeletal system: Secondary | ICD-10-CM

## 2022-03-01 DIAGNOSIS — R258 Other abnormal involuntary movements: Secondary | ICD-10-CM

## 2022-03-01 DIAGNOSIS — R200 Anesthesia of skin: Secondary | ICD-10-CM | POA: Diagnosis not present

## 2022-03-01 DIAGNOSIS — M79601 Pain in right arm: Secondary | ICD-10-CM

## 2022-03-01 DIAGNOSIS — G500 Trigeminal neuralgia: Secondary | ICD-10-CM

## 2022-03-01 DIAGNOSIS — Q283 Other malformations of cerebral vessels: Secondary | ICD-10-CM

## 2022-03-01 DIAGNOSIS — R531 Weakness: Secondary | ICD-10-CM | POA: Diagnosis not present

## 2022-03-01 DIAGNOSIS — R269 Unspecified abnormalities of gait and mobility: Secondary | ICD-10-CM

## 2022-03-01 DIAGNOSIS — R292 Abnormal reflex: Secondary | ICD-10-CM

## 2022-03-01 DIAGNOSIS — G379 Demyelinating disease of central nervous system, unspecified: Secondary | ICD-10-CM

## 2022-03-01 DIAGNOSIS — M2550 Pain in unspecified joint: Secondary | ICD-10-CM

## 2022-03-01 DIAGNOSIS — Z820 Family history of epilepsy and other diseases of the nervous system: Secondary | ICD-10-CM

## 2022-03-01 DIAGNOSIS — R202 Paresthesia of skin: Secondary | ICD-10-CM

## 2022-03-01 DIAGNOSIS — R29818 Other symptoms and signs involving the nervous system: Secondary | ICD-10-CM

## 2022-03-01 DIAGNOSIS — W57XXXA Bitten or stung by nonvenomous insect and other nonvenomous arthropods, initial encounter: Secondary | ICD-10-CM

## 2022-03-01 DIAGNOSIS — I7771 Dissection of carotid artery: Secondary | ICD-10-CM

## 2022-03-01 MED ORDER — GADOBENATE DIMEGLUMINE 529 MG/ML IV SOLN
10.0000 mL | Freq: Once | INTRAVENOUS | Status: AC | PRN
  Administered 2022-03-01: 10 mL via INTRAVENOUS

## 2022-03-16 ENCOUNTER — Ambulatory Visit: Payer: BC Managed Care – PPO | Admitting: Student

## 2022-06-20 DIAGNOSIS — D2261 Melanocytic nevi of right upper limb, including shoulder: Secondary | ICD-10-CM | POA: Diagnosis not present

## 2022-06-20 DIAGNOSIS — D2221 Melanocytic nevi of right ear and external auricular canal: Secondary | ICD-10-CM | POA: Diagnosis not present

## 2022-06-20 DIAGNOSIS — L819 Disorder of pigmentation, unspecified: Secondary | ICD-10-CM | POA: Diagnosis not present

## 2022-06-20 DIAGNOSIS — L821 Other seborrheic keratosis: Secondary | ICD-10-CM | POA: Diagnosis not present

## 2022-08-22 ENCOUNTER — Telehealth: Payer: Self-pay | Admitting: Orthopaedic Surgery

## 2022-08-22 NOTE — Telephone Encounter (Signed)
I spoke to Gloria Turner this evening about her left knee that I had seen her for in January for 2023.  She went hiking this past weekend and around mile 7, she felt a pop in her knee and later that evening and the next day, she developed clicking and an effusion and pain.  Given this recent injury and worsening symptoms and due to the fact that she had a nondisplaced MMT on a previous MRI, I have recommended an updated MRI to fully assess the extent of the MMT.  I will go ahead and order an MRI of the left knee.

## 2022-08-23 ENCOUNTER — Other Ambulatory Visit: Payer: Self-pay

## 2022-08-23 DIAGNOSIS — G8929 Other chronic pain: Secondary | ICD-10-CM

## 2022-08-24 ENCOUNTER — Ambulatory Visit
Admission: RE | Admit: 2022-08-24 | Discharge: 2022-08-24 | Disposition: A | Discharge status: Home / Self Care | Payer: BC Managed Care – PPO | Source: Ambulatory Visit | Attending: Orthopaedic Surgery | Admitting: Orthopaedic Surgery

## 2022-08-24 DIAGNOSIS — G8929 Other chronic pain: Secondary | ICD-10-CM

## 2022-08-24 DIAGNOSIS — M25562 Pain in left knee: Secondary | ICD-10-CM | POA: Diagnosis not present

## 2022-08-24 NOTE — Progress Notes (Signed)
Please have patient follow up to discuss MRI.  Thanks

## 2022-08-25 ENCOUNTER — Other Ambulatory Visit: Payer: Self-pay

## 2022-08-25 ENCOUNTER — Ambulatory Visit (INDEPENDENT_AMBULATORY_CARE_PROVIDER_SITE_OTHER): Payer: BC Managed Care – PPO | Admitting: Orthopaedic Surgery

## 2022-08-25 ENCOUNTER — Encounter (HOSPITAL_BASED_OUTPATIENT_CLINIC_OR_DEPARTMENT_OTHER): Payer: Self-pay | Admitting: Orthopaedic Surgery

## 2022-08-25 DIAGNOSIS — S83242A Other tear of medial meniscus, current injury, left knee, initial encounter: Secondary | ICD-10-CM | POA: Diagnosis not present

## 2022-08-25 NOTE — Progress Notes (Signed)
   Office Visit Note   Patient: Gloria Turner           Date of Birth: 02-02-1971           MRN: [id number] Visit Date: 08/25/2022              Requested by: Chilton Greathouse, MD [address] [place],  [place] [address] PCP: Chilton Greathouse, MD   Assessment & Plan: Visit Diagnoses:  1. Acute medial meniscus tear of left knee, initial encounter     Plan: Impression is 52 year old female with displaced left medial meniscal tear with pain and mechanical symptoms and effusion.  The new MRI shows definite worsening of the tear compared to the prior MRI a year ago.  Based on these findings and worsening symptoms I have recommended knee arthroscopy with partial medial meniscectomy and chondroplasty as indicated.  Risk benefits prognosis reviewed.  Follow-Up Instructions: No follow-ups on file.   Orders:  No orders of the defined types were placed in this encounter.  No orders of the defined types were placed in this encounter.     Procedures: No procedures performed   Clinical Data: No additional findings.   Subjective: No chief complaint on file.   HPI  Brittain returns today to discuss left knee MRI scan.  Reports continued pain and mechanical symptoms inside the knee.  Review of Systems   Objective: Vital Signs: There were no vitals taken for this visit.  Physical Exam  Ortho Exam  Exam of left knee shows medial joint line tenderness with positive mcmurray and joint effusion. Pain and locking with ROM.  Specialty Comments:  No specialty comments available.  Imaging: No results found.   PMFS History: Patient Active Problem List   Diagnosis Date Noted   Acute medial meniscus tear of left knee 08/25/2022   Left sided numbness 06/13/2021   Acute focal neurological deficit 06/13/2021   Supraventricular tachycardia 05/24/2021   Seasonal allergies 05/24/2021   Past Medical History:  Diagnosis Date   Allergy    seasonal allergies    Family History   Problem Relation Age of Onset   Colon polyps Father 39   Colon cancer Maternal Grandmother 55   Colon polyps Maternal Grandmother 55   Colon cancer Maternal Grandfather 55   Colon polyps Maternal Grandfather 35   Stomach cancer Paternal Grandmother 37   Healthy Daughter    Healthy Son    Multiple sclerosis Maternal Aunt    Multiple sclerosis Cousin    Esophageal cancer Neg Hx    Rectal cancer Neg Hx     Past Surgical History:  Procedure Laterality Date   CESAREAN SECTION     x 2   COLONOSCOPY WITH PROPOFOL N/A 08/18/2020   Procedure: COLONOSCOPY WITH PROPOFOL;  Surgeon: Iva Boop, MD;  Location: [facility];  Service: Endoscopy;  Laterality: N/A;   WISDOM TOOTH EXTRACTION     Social History   Occupational History   Not on file  Tobacco Use   Smoking status: Never   Smokeless tobacco: Never  Vaping Use   Vaping Use: Never used  Substance and Sexual Activity   Alcohol use: Yes    Alcohol/week: 4.0 standard drinks of alcohol    Types: 4 Standard drinks or equivalent per week    Comment: occ   Drug use: Never   Sexual activity: Yes

## 2022-08-25 NOTE — Progress Notes (Signed)

## 2022-08-26 ENCOUNTER — Ambulatory Visit (HOSPITAL_BASED_OUTPATIENT_CLINIC_OR_DEPARTMENT_OTHER): Payer: BC Managed Care – PPO | Admitting: Certified Registered Nurse Anesthetist

## 2022-08-26 ENCOUNTER — Encounter (HOSPITAL_BASED_OUTPATIENT_CLINIC_OR_DEPARTMENT_OTHER)
Admission: RE | Disposition: A | Discharge status: Home / Self Care | Payer: Self-pay | Source: Ambulatory Visit | Attending: Orthopaedic Surgery

## 2022-08-26 ENCOUNTER — Encounter: Payer: Self-pay | Admitting: Orthopaedic Surgery

## 2022-08-26 ENCOUNTER — Other Ambulatory Visit: Payer: Self-pay

## 2022-08-26 ENCOUNTER — Encounter (HOSPITAL_BASED_OUTPATIENT_CLINIC_OR_DEPARTMENT_OTHER): Payer: Self-pay | Admitting: Orthopaedic Surgery

## 2022-08-26 ENCOUNTER — Ambulatory Visit (HOSPITAL_BASED_OUTPATIENT_CLINIC_OR_DEPARTMENT_OTHER)
Admission: RE | Admit: 2022-08-26 | Discharge: 2022-08-26 | Disposition: A | Discharge status: Home / Self Care | Payer: BC Managed Care – PPO | Source: Ambulatory Visit | Attending: Orthopaedic Surgery | Admitting: Orthopaedic Surgery

## 2022-08-26 DIAGNOSIS — S83232A Complex tear of medial meniscus, current injury, left knee, initial encounter: Secondary | ICD-10-CM | POA: Diagnosis not present

## 2022-08-26 DIAGNOSIS — S83242A Other tear of medial meniscus, current injury, left knee, initial encounter: Secondary | ICD-10-CM | POA: Diagnosis not present

## 2022-08-26 DIAGNOSIS — X58XXXA Exposure to other specified factors, initial encounter: Secondary | ICD-10-CM | POA: Insufficient documentation

## 2022-08-26 DIAGNOSIS — Z01818 Encounter for other preprocedural examination: Secondary | ICD-10-CM

## 2022-08-26 DIAGNOSIS — G8918 Other acute postprocedural pain: Secondary | ICD-10-CM | POA: Diagnosis not present

## 2022-08-26 HISTORY — PX: CHONDROPLASTY: SHX5177

## 2022-08-26 HISTORY — PX: KNEE ARTHROSCOPY WITH MEDIAL MENISECTOMY: SHX5651

## 2022-08-26 SURGERY — ARTHROSCOPY, KNEE, WITH MEDIAL MENISCECTOMY
Anesthesia: General | Site: Knee | Laterality: Left

## 2022-08-26 MED ORDER — MIDAZOLAM HCL 2 MG/2ML IJ SOLN
2.0000 mg | Freq: Once | INTRAMUSCULAR | Status: AC
  Administered 2022-08-26: 2 mg via INTRAVENOUS

## 2022-08-26 MED ORDER — SCOPOLAMINE 1 MG/3DAYS TD PT72
MEDICATED_PATCH | TRANSDERMAL | Status: AC
  Filled 2022-08-26: qty 1

## 2022-08-26 MED ORDER — ACETAMINOPHEN 500 MG PO TABS: 1000.0000 mg | ORAL_TABLET | Freq: Once | ORAL | Status: DC

## 2022-08-26 MED ORDER — ACETAMINOPHEN 10 MG/ML IV SOLN
INTRAVENOUS | Status: AC
  Filled 2022-08-26: qty 100

## 2022-08-26 MED ORDER — PROPOFOL 10 MG/ML IV BOLUS
INTRAVENOUS | Status: DC | PRN
  Administered 2022-08-26: 30 mg via INTRAVENOUS
  Administered 2022-08-26: 150 mg via INTRAVENOUS
  Administered 2022-08-26 (×3): 20 mg via INTRAVENOUS

## 2022-08-26 MED ORDER — CEFAZOLIN SODIUM-DEXTROSE 2-4 GM/100ML-% IV SOLN
INTRAVENOUS | Status: AC
  Filled 2022-08-26: qty 100

## 2022-08-26 MED ORDER — LACTATED RINGERS IV SOLN: INTRAVENOUS | Status: DC

## 2022-08-26 MED ORDER — OXYCODONE HCL 5 MG/5ML PO SOLN: 5.0000 mg | Freq: Once | ORAL | Status: DC | PRN

## 2022-08-26 MED ORDER — ACETAMINOPHEN 10 MG/ML IV SOLN: 1000.0000 mg | Freq: Once | INTRAVENOUS | Status: DC | PRN

## 2022-08-26 MED ORDER — PROMETHAZINE HCL 25 MG/ML IJ SOLN: 6.2500 mg | INTRAMUSCULAR | Status: DC | PRN

## 2022-08-26 MED ORDER — BUPIVACAINE HCL (PF) 0.25 % IJ SOLN
INTRAMUSCULAR | Status: AC
  Filled 2022-08-26: qty 30

## 2022-08-26 MED ORDER — BUPIVACAINE HCL (PF) 0.25 % IJ SOLN
INTRAMUSCULAR | Status: DC | PRN
  Administered 2022-08-26: 20 mL

## 2022-08-26 MED ORDER — FENTANYL CITRATE (PF) 100 MCG/2ML IJ SOLN
100.0000 ug | Freq: Once | INTRAMUSCULAR | Status: AC
  Administered 2022-08-26: 50 ug via INTRAVENOUS

## 2022-08-26 MED ORDER — OXYCODONE HCL 5 MG PO TABS: 5.0000 mg | ORAL_TABLET | Freq: Once | ORAL | Status: DC | PRN

## 2022-08-26 MED ORDER — PROPOFOL 500 MG/50ML IV EMUL
INTRAVENOUS | Status: DC | PRN
  Administered 2022-08-26: 100 ug/kg/min via INTRAVENOUS

## 2022-08-26 MED ORDER — AMISULPRIDE (ANTIEMETIC) 5 MG/2ML IV SOLN: 10.0000 mg | Freq: Once | INTRAVENOUS | Status: DC | PRN

## 2022-08-26 MED ORDER — FENTANYL CITRATE (PF) 250 MCG/5ML IJ SOLN
INTRAMUSCULAR | Status: DC | PRN
  Administered 2022-08-26: 50 ug via INTRAVENOUS

## 2022-08-26 MED ORDER — FENTANYL CITRATE (PF) 100 MCG/2ML IJ SOLN: 25.0000 ug | INTRAMUSCULAR | Status: DC | PRN

## 2022-08-26 MED ORDER — FENTANYL CITRATE (PF) 100 MCG/2ML IJ SOLN
INTRAMUSCULAR | Status: AC
  Filled 2022-08-26: qty 2

## 2022-08-26 MED ORDER — ONDANSETRON HCL 4 MG/2ML IJ SOLN
INTRAMUSCULAR | Status: DC | PRN
  Administered 2022-08-26: 4 mg via INTRAVENOUS

## 2022-08-26 MED ORDER — ACETAMINOPHEN 10 MG/ML IV SOLN
INTRAVENOUS | Status: DC | PRN
  Administered 2022-08-26: 1000 mg via INTRAVENOUS

## 2022-08-26 MED ORDER — ACETAMINOPHEN 325 MG PO TABS: 325.0000 mg | ORAL_TABLET | ORAL | Status: DC | PRN

## 2022-08-26 MED ORDER — ACETAMINOPHEN 160 MG/5ML PO SOLN: 325.0000 mg | ORAL | Status: DC | PRN

## 2022-08-26 MED ORDER — PROPOFOL 1000 MG/100ML IV EMUL
INTRAVENOUS | Status: AC
  Filled 2022-08-26: qty 100

## 2022-08-26 MED ORDER — MIDAZOLAM HCL 2 MG/2ML IJ SOLN
INTRAMUSCULAR | Status: AC
  Filled 2022-08-26: qty 2

## 2022-08-26 MED ORDER — SCOPOLAMINE 1 MG/3DAYS TD PT72
1.0000 | MEDICATED_PATCH | TRANSDERMAL | Status: DC
  Administered 2022-08-26: 1.5 mg via TRANSDERMAL

## 2022-08-26 MED ORDER — ONDANSETRON HCL 4 MG/2ML IJ SOLN
INTRAMUSCULAR | Status: AC
  Filled 2022-08-26: qty 2

## 2022-08-26 MED ORDER — HYDROCODONE-ACETAMINOPHEN 5-325 MG PO TABS: 1.0000 | ORAL_TABLET | Freq: Four times a day (QID) | ORAL | 0 refills | Status: DC | PRN

## 2022-08-26 MED ORDER — ONDANSETRON HCL 4 MG PO TABS: 4.0000 mg | ORAL_TABLET | Freq: Three times a day (TID) | ORAL | 0 refills | Status: DC | PRN

## 2022-08-26 MED ORDER — PHENYLEPHRINE 80 MCG/ML (10ML) SYRINGE FOR IV PUSH (FOR BLOOD PRESSURE SUPPORT)
PREFILLED_SYRINGE | INTRAVENOUS | Status: DC | PRN
  Administered 2022-08-26: 80 ug via INTRAVENOUS

## 2022-08-26 MED ORDER — CEFAZOLIN SODIUM-DEXTROSE 2-4 GM/100ML-% IV SOLN
2.0000 g | INTRAVENOUS | Status: AC
  Administered 2022-08-26: 2 g via INTRAVENOUS

## 2022-08-26 MED ORDER — ARTIFICIAL TEARS OPHTHALMIC OINT
TOPICAL_OINTMENT | OPHTHALMIC | Status: AC
  Filled 2022-08-26: qty 3.5

## 2022-08-26 MED ORDER — DEXAMETHASONE SODIUM PHOSPHATE 10 MG/ML IJ SOLN
INTRAMUSCULAR | Status: DC | PRN
  Administered 2022-08-26: 4 mg via INTRAVENOUS

## 2022-08-26 MED ORDER — SODIUM CHLORIDE 0.9 % IR SOLN
Status: DC | PRN
  Administered 2022-08-26: 4000 mL

## 2022-08-26 MED ORDER — DEXAMETHASONE SODIUM PHOSPHATE 10 MG/ML IJ SOLN
INTRAMUSCULAR | Status: AC
  Filled 2022-08-26: qty 1

## 2022-08-26 MED ORDER — LIDOCAINE 2% (20 MG/ML) 5 ML SYRINGE
INTRAMUSCULAR | Status: AC
  Filled 2022-08-26: qty 5

## 2022-08-26 MED ORDER — CELECOXIB 200 MG PO CAPS: 200.0000 mg | ORAL_CAPSULE | Freq: Two times a day (BID) | ORAL | 0 refills | Status: AC

## 2022-08-26 MED ORDER — LIDOCAINE 2% (20 MG/ML) 5 ML SYRINGE
INTRAMUSCULAR | Status: DC | PRN
  Administered 2022-08-26: 40 mg via INTRAVENOUS

## 2022-08-26 SURGICAL SUPPLY — 46 items
BANDAGE ESMARK 6X9 LF (GAUZE/BANDAGES/DRESSINGS) IMPLANT
BLADE EXCALIBUR 4.0X13 (MISCELLANEOUS) IMPLANT
BLADE SHAVER TORPEDO 4X13 (MISCELLANEOUS) ×1 IMPLANT
BNDG CMPR 5X62 HK CLSR LF (GAUZE/BANDAGES/DRESSINGS) ×2
BNDG CMPR 6"X 5 YARDS HK CLSR (GAUZE/BANDAGES/DRESSINGS) ×2
BNDG CMPR 9X6 STRL LF SNTH (GAUZE/BANDAGES/DRESSINGS)
BNDG ELASTIC 6INX 5YD STR LF (GAUZE/BANDAGES/DRESSINGS) ×2 IMPLANT
BNDG ESMARK 6X9 LF (GAUZE/BANDAGES/DRESSINGS)
BURR OVAL 8 FLU 4.0X13 (MISCELLANEOUS) IMPLANT
COOLER ICEMAN CLASSIC (MISCELLANEOUS) ×1 IMPLANT
CUFF TOURN SGL QUICK 24 (TOURNIQUET CUFF) ×1
CUFF TOURN SGL QUICK 34 (TOURNIQUET CUFF)
CUFF TRNQT CYL 24X4X16.5-23 (TOURNIQUET CUFF) IMPLANT
CUFF TRNQT CYL 34X4.125X (TOURNIQUET CUFF) ×1 IMPLANT
CUTTER BONE 4.0MM X 13CM (MISCELLANEOUS) IMPLANT
DISSECTOR 3.5MM X 13CM CVD (MISCELLANEOUS) IMPLANT
DRAPE ARTHROSCOPY W/POUCH 90 (DRAPES) ×1 IMPLANT
DRAPE IMP U-DRAPE 54X76 (DRAPES) ×1 IMPLANT
DRAPE U-SHAPE 47X51 STRL (DRAPES) ×1 IMPLANT
DURAPREP 26ML APPLICATOR (WOUND CARE) ×2 IMPLANT
ELECT MENISCUS 165MM 90D (ELECTRODE) IMPLANT
EXCALIBUR 3.8MM X 13CM (MISCELLANEOUS) IMPLANT
GAUZE SPONGE 4X4 12PLY STRL (GAUZE/BANDAGES/DRESSINGS) ×1 IMPLANT
GAUZE XEROFORM 1X8 LF (GAUZE/BANDAGES/DRESSINGS) ×1 IMPLANT
GLOVE BIO SURGEON STRL SZ 6.5 (GLOVE) IMPLANT
GLOVE BIOGEL PI IND STRL 7.0 (GLOVE) IMPLANT
GLOVE BIOGEL PI IND STRL 7.5 (GLOVE) IMPLANT
GLOVE ECLIPSE 7.0 STRL STRAW (GLOVE) ×2 IMPLANT
GLOVE INDICATOR 7.0 STRL GRN (GLOVE) ×2 IMPLANT
GLOVE INDICATOR 7.5 STRL GRN (GLOVE) ×1 IMPLANT
GLOVE SURG SYN 7.5  E (GLOVE) ×1
GLOVE SURG SYN 7.5 E (GLOVE) ×1 IMPLANT
GLOVE SURG SYN 7.5 PF PI (GLOVE) ×1 IMPLANT
GOWN STRL REUS W/ TWL LRG LVL3 (GOWN DISPOSABLE) ×1 IMPLANT
GOWN STRL REUS W/TWL LRG LVL3 (GOWN DISPOSABLE) ×1
GOWN STRL SURGICAL XL XLNG (GOWN DISPOSABLE) ×2 IMPLANT
MANIFOLD NEPTUNE II (INSTRUMENTS) ×1 IMPLANT
PACK ARTHROSCOPY DSU (CUSTOM PROCEDURE TRAY) ×1 IMPLANT
PACK BASIN DAY SURGERY FS (CUSTOM PROCEDURE TRAY) ×1 IMPLANT
PAD COLD SHLDR WRAP-ON (PAD) ×1 IMPLANT
PENCIL SMOKE EVACUATOR (MISCELLANEOUS) IMPLANT
RESECTOR TORPEDO 4MM 13CM CVD (MISCELLANEOUS) IMPLANT
SHEET MEDIUM DRAPE 40X70 STRL (DRAPES) ×1 IMPLANT
SUT ETHILON 3 0 PS 1 (SUTURE) ×1 IMPLANT
TOWEL GREEN STERILE FF (TOWEL DISPOSABLE) ×1 IMPLANT
TUBING ARTHROSCOPY IRRIG 16FT (MISCELLANEOUS) ×1 IMPLANT

## 2022-08-26 NOTE — Transfer of Care (Signed)
Immediate Anesthesia Transfer of Care Note  Patient: Gloria Turner  Procedure(s) Performed: LEFT KNEE ARTHROSCOPY, PARTIAL MEDIAL MENISCECTOMY (Left: Knee) CHONDROPLASTY (Left: Knee)  Patient Location: PACU  Anesthesia Type:General  Level of Consciousness: drowsy  Airway & Oxygen Therapy: Patient Spontanous Breathing and Patient connected to nasal cannula oxygen  Post-op Assessment: Report given to RN and Post -op Vital signs reviewed and stable  Post vital signs: Reviewed and stable  Last Vitals:  Vitals Value Taken Time  BP 89/45 08/26/22 1222  Temp    Pulse 59 08/26/22 1224  Resp 16 08/26/22 1224  SpO2 94 % 08/26/22 1224  Vitals shown include unvalidated device data.  Last Pain:  Vitals:   08/26/22 0955  TempSrc: Oral  PainSc: 0-No pain      Patients Stated Pain Goal: 4 (08/26/22 0955)  Complications: No notable events documented.

## 2022-08-26 NOTE — Discharge Instructions (Addendum)
Post-operative patient instructions  Knee Arthroscopy   Ice:  Place intermittent ice or cooler pack over your knee, 30 minutes on and 30 minutes off.  Continue this for the first 72 hours after surgery, then save ice for use after therapy sessions or on more active days.   Weight:  You may bear weight on your leg as your symptoms allow. Crutches:  Use crutches (or walker) to assist in walking until told to discontinue by your physical therapist or physician. This will help to reduce pain. Strengthening:  Perform simple thigh squeezes (isometric quad contractions) and straight leg lifts as you are able (3 sets of 5 to 10 repetitions, 3 times a day).  For the leg lifts, have someone support under your ankle in the beginning until you have increased strength enough to do this on your own.  To help get started on thigh squeezes, place a pillow under your knee and push down on the pillow with back of knee (sometimes easier to do than with your leg fully straight). Motion:  Perform gentle knee motion as tolerated - this is gentle bending and straightening of the knee. Seated heel slides: you can start by sitting in a chair, remove your brace, and gently slide your heel back on the floor - allowing your knee to bend. Have someone help you straighten your knee (or use your other leg/foot hooked under your ankle.  Dressing:  Perform 1st dressing change at 2 days postoperative. A moderate amount of blood tinged drainage is to be expected.  So if you bleed through the dressing on the first or second day or if you have fevers, it is fine to change the dressing/check the wounds early and redress wound. Elevate your leg.  If it bleeds through again, or if the incisions are leaking frank blood, please call the office. May change dressing every 1-2 days thereafter to help watch wounds. Can purchase Tegaderm (or 1M Nexcare) water resistant dressings at local pharmacy / [facility]. Shower:  Light shower is ok after 2  days.  Please take shower, NO bath. Recover with gauze and ace wrap to help keep wounds protected.   Pain medication:  A narcotic pain medication has been prescribed.  Take as directed.  Typically you need narcotic pain medication more regularly during the first 3 to 5 days after surgery.  Decrease your use of the medication as the pain improves.  Narcotics can sometimes cause constipation, even after a few doses.  If you have problems with constipation, you can take an over the counter stool softener or light laxative.  If you have persistent problems, please notify your physician's office. Physical therapy: Additional activity guidelines to be provided by your physician or physical therapist at follow-up visits.  Driving: Do not recommend driving x 1 week post surgical, especially if surgery performed on right side. Should not drive while taking narcotic pain medications. It typically takes at least 1 week to restore sufficient neuromuscular function for normal reaction times for driving safety.  Call [phone] for questions or problems. Evenings you will be forwarded to the hospital operator.  Ask for the orthopaedic physician on call. Please call if you experience:    Redness, foul smelling, or persistent drainage from the surgical site  worsening knee pain and swelling not responsive to medication  any calf pain and or swelling of the lower leg  temperatures greater than 101.5 F other questions or concerns   Thank you for allowing [place] to be  a part of your care.    Post Anesthesia Home Care Instructions  Activity: Get plenty of rest for the remainder of the day. A responsible individual must stay with you for 24 hours following the procedure.  For the next 24 hours, DO NOT: -Drive a car -Advertising copywriter -Drink alcoholic beverages -Take any medication unless instructed by your physician -Make any legal decisions or sign important papers.  Meals: Start with liquid foods such as  gelatin or soup. Progress to regular foods as tolerated. Avoid greasy, spicy, heavy foods. If nausea and/or vomiting occur, drink only clear liquids until the nausea and/or vomiting subsides. Call your physician if vomiting continues.  Special Instructions/Symptoms: Your throat may feel dry or sore from the anesthesia or the breathing tube placed in your throat during surgery. If this causes discomfort, gargle with warm salt water. The discomfort should disappear within 24 hours.  If you had a scopolamine patch placed behind your ear for the management of post- operative nausea and/or vomiting:  1. The medication in the patch is effective for 72 hours, after which it should be removed.  Wrap patch in a tissue and discard in the trash. Wash hands thoroughly with soap and water. 2. You may remove the patch earlier than 72 hours if you experience unpleasant side effects which may include dry mouth, dizziness or visual disturbances. 3. Avoid touching the patch. Wash your hands with soap and water after contact with the patch.     Regional Anesthesia Blocks  1. Numbness or the inability to move the "blocked" extremity may last from 3-48 hours after placement. The length of time depends on the medication injected and your individual response to the medication. If the numbness is not going away after 48 hours, call your surgeon.  2. The extremity that is blocked will need to be protected until the numbness is gone and the  Strength has returned. Because you cannot feel it, you will need to take extra care to avoid injury. Because it may be weak, you may have difficulty moving it or using it. You may not know what position it is in without looking at it while the block is in effect.  3. For blocks in the legs and feet, returning to weight bearing and walking needs to be done carefully. You will need to wait until the numbness is entirely gone and the strength has returned. You should be able to move your  leg and foot normally before you try and bear weight or walk. You will need someone to be with you when you first try to ensure you do not fall and possibly risk injury.  4. Bruising and tenderness at the needle site are common side effects and will resolve in a few days.  5. Persistent numbness or new problems with movement should be communicated to the surgeon or the [facility] [phone] [facility] [phone]).  Can take tylenol if needed after 6 pm

## 2022-08-26 NOTE — Op Note (Signed)
   Surgery Date: 08/26/2022  PREOPERATIVE DIAGNOSES:  1. Left knee medial meniscus tear  POSTOPERATIVE DIAGNOSES:  Left knee medial meniscus tear Left knee synovitis  PROCEDURES PERFORMED:  1. Left knee arthroscopy with limited synovectomy 2. Left knee arthroscopy with arthroscopic partial medial meniscectomy 3. Left knee arthroscopy with arthroscopic chondroplasty medial and lateral femoral condyle.  SURGEON: N. Glee Arvin, M.D.  ASSIST: None  ANESTHESIA:  general  FLUIDS: Per anesthesia record.   ESTIMATED BLOOD LOSS: minimal  DESCRIPTION OF PROCEDURE: Gloria Turner is a 52 y.o.-year-old female with above mentioned conditions. Full discussion held regarding risks benefits alternatives and complications related surgical intervention. Conservative care options reviewed. All questions answered.  The patient was identified in the preoperative holding area and the operative extremity was marked. The patient was brought to the operating room and transferred to operating table in a supine position. Satisfactory general anesthesia was induced by anesthesiology.    Standard anterolateral, anteromedial arthroscopy portals were obtained. The anteromedial portal was obtained with a spinal needle for localization under direct visualization with subsequent diagnostic findings.   Synovitis was immediately encountered in the femoral notch and in the gutters.  There was erythema of the ACL.  Limited synovectomy was performed for visualization.  A valgus force was placed on the knee to evaluate the medial compartment.  Grade I chondromalacia was found without any unstable cartilage.  A complex tear of the medial meniscus was found.  A portion of the meniscus had flipped down between the MCL and the medial tibial plateau.  This was retrieved with a probe.  The displaced flaps were resected back to stable margins.  This only involved about 15-20  of the volume of the meniscus.  The tear did wrap around  from the mid body to the posterior horn.  The meniscal root was intact to probing.  I was very happy with the result.  The cruciates were then evaluated and were stable.  Slight erythema to the ACL.  The knee was then placed into the figure-of-four position to evaluate the lateral compartment.  The area of chondromalacia that corresponded to the MRI was only grade 1.  There was some mild degenerative fraying to the lateral meniscus without any real tears.  The knee was then placed into extension.  Patellofemoral compartment showed no chondromalacia.  Synovitis was seen in the suprapatellar pouch.  Gutters were checked for loose bodies.  Excess fluid was removed from the knee joint.  Incisions were closed with interrupted nylon sutures.  Sterile dressings were applied.  Patient tolerated procedure well had no immediate complications.  Suprapatellar pouch and gutters: moderate synovitis or debris. Patella chondral surface: Grade 0 Trochlear chondral surface: Grade 0 Patellofemoral tracking: normal Medial meniscus: complex tear midbody, posterior horn 20% resected.  Medial femoral condyle weight bearing surface: Grade 1 Medial tibial plateau: Grade 1 Anterior cruciate ligament:stable Posterior cruciate ligament:stable Lateral meniscus: normal.   Lateral femoral condyle weight bearing surface: Grade 1 Lateral tibial plateau: Grade 1  DISPOSITION: The patient was awakened from general anesthetic, extubated, taken to the recovery room in medically stable condition, no apparent complications. The patient may be weightbearing as tolerated to the operative lower extremity.  Range of motion of right knee as tolerated.  Gloria Reel, MD [facility] 1:27 PM

## 2022-08-26 NOTE — Progress Notes (Signed)
Assisted Dr. Hollis with left, adductor canal, ultrasound guided block. Side rails up, monitors on throughout procedure. See vital signs in flow sheet. Tolerated Procedure well.  

## 2022-08-26 NOTE — Anesthesia Procedure Notes (Signed)
Procedure Name: LMA Insertion Date/Time: 08/26/2022 11:18 AM  Performed by: Demetrio Lapping, CRNAPre-anesthesia Checklist: Patient identified, Emergency Drugs available, Suction available and Patient being monitored Patient Re-evaluated:Patient Re-evaluated prior to induction Oxygen Delivery Method: Circle System Utilized Preoxygenation: Pre-oxygenation with 100% oxygen Induction Type: IV induction Ventilation: Mask ventilation without difficulty LMA: LMA inserted LMA Size: 3.0 Number of attempts: 1 Airway Equipment and Method: Bite block Placement Confirmation: positive ETCO2 Tube secured with: Tape Dental Injury: Teeth and Oropharynx as per pre-operative assessment

## 2022-08-26 NOTE — Anesthesia Postprocedure Evaluation (Signed)
Anesthesia Post Note  Patient: Gloria Turner  Procedure(s) Performed: LEFT KNEE ARTHROSCOPY, PARTIAL MEDIAL MENISCECTOMY (Left: Knee) CHONDROPLASTY (Left: Knee)     Patient location during evaluation: PACU Anesthesia Type: General Level of consciousness: awake and alert Pain management: pain level controlled Vital Signs Assessment: post-procedure vital signs reviewed and stable Respiratory status: spontaneous breathing, nonlabored ventilation, respiratory function stable and patient connected to nasal cannula oxygen Cardiovascular status: blood pressure returned to baseline and stable Postop Assessment: no apparent nausea or vomiting Anesthetic complications: no  No notable events documented.  Last Vitals:  Vitals:   08/26/22 1245 08/26/22 1251  BP: (!) 111/57   Pulse: (!) 49 (!) 48  Resp: 14 16  Temp:    SpO2: 95% 95%    Last Pain:  Vitals:   08/26/22 1251  TempSrc:   PainSc: 0-No pain                 Shelton Silvas

## 2022-08-26 NOTE — H&P (Signed)
PREOPERATIVE H&P  Chief Complaint: left knee medial meniscal tear  HPI: Gloria Turner is a 52 y.o. female who presents for surgical treatment of left knee medial meniscal tear.  She denies any changes in medical history.  Past Medical History:  Diagnosis Date   Allergy    seasonal allergies   Past Surgical History:  Procedure Laterality Date   CESAREAN SECTION     x 2   COLONOSCOPY WITH PROPOFOL N/A 08/18/2020   Procedure: COLONOSCOPY WITH PROPOFOL;  Surgeon: Iva Boop, MD;  Location: [facility];  Service: Endoscopy;  Laterality: N/A;   WISDOM TOOTH EXTRACTION     Social History   Socioeconomic History   Marital status: Married    Spouse name: Peterson Lombard   Number of children: 2   Years of education: Not on file   Highest education level: Bachelor's degree (e.g., BA, AB, BS)  Occupational History   Not on file  Tobacco Use   Smoking status: Never   Smokeless tobacco: Never  Vaping Use   Vaping Use: Never used  Substance and Sexual Activity   Alcohol use: Yes    Alcohol/week: 4.0 standard drinks of alcohol    Types: 4 Standard drinks or equivalent per week    Comment: occ   Drug use: Never   Sexual activity: Yes  Other Topics Concern   Not on file  Social History Narrative   Married - 1 son 1 daughter    Husband Dr. Autumn Patty   Self-employed trainer   occ EtOH never smoker/drugs/tobacco   Right handed    Social Determinants of Health   Financial Resource Strain: Not on file  Food Insecurity: Not on file  Transportation Needs: Not on file  Physical Activity: Not on file  Stress: Not on file  Social Connections: Not on file   Family History  Problem Relation Age of Onset   Colon polyps Father 33   Colon cancer Maternal Grandmother 55   Colon polyps Maternal Grandmother 55   Colon cancer Maternal Grandfather 55   Colon polyps Maternal Grandfather 79   Stomach cancer Paternal Grandmother 21   Healthy Daughter    Healthy Son     Multiple sclerosis Maternal Aunt    Multiple sclerosis Cousin    Esophageal cancer Neg Hx    Rectal cancer Neg Hx    No Known Allergies Prior to Admission medications   Medication Sig Start Date End Date Taking? Authorizing Provider  fluticasone (FLONASE) 50 MCG/ACT nasal spray Place 1 spray into both nostrils daily as needed for allergies or rhinitis (Seasonal).    [provider]  fluticasone Aleda Grana) 50 MCG/ACT nasal spray  02/25/13   [provider]  Vitamin D, Ergocalciferol, (DRISDOL) 1.25 MG (50000 UNIT) CAPS capsule Take 1 capsule (50,000 Units total) by mouth every 7 (seven) days. 05/27/21   Pollyann Savoy, MD     Positive ROS: All other systems have been reviewed and were otherwise negative with the exception of those mentioned in the HPI and as above.  Physical Exam: General: Alert, no acute distress Cardiovascular: No pedal edema Respiratory: No cyanosis, no use of accessory musculature GI: abdomen soft Skin: No lesions in the area of chief complaint Neurologic: Sensation intact distally Psychiatric: Patient is competent for consent with normal mood and affect Lymphatic: no lymphedema  MUSCULOSKELETAL: exam stable  Assessment: left knee medial meniscal tear  Plan: Plan for Procedure(s): LEFT KNEE ARTHROSCOPY, PARTIAL MEDIAL MENISCECTOMY  The risks benefits and alternatives  were discussed with the patient including but not limited to the risks of nonoperative treatment, versus surgical intervention including infection, bleeding, nerve injury,  blood clots, cardiopulmonary complications, morbidity, mortality, among others, and they were willing to proceed.   Glee Arvin, MD 08/26/2022 9:41 AM

## 2022-08-26 NOTE — Anesthesia Preprocedure Evaluation (Addendum)
Anesthesia Evaluation  Patient identified by MRN, date of birth, ID band Patient awake    Reviewed: Allergy & Precautions, NPO status , Patient's Chart, lab work & pertinent test results  Airway Mallampati: I  TM Distance: >3 FB Neck ROM: Full    Dental  (+) Teeth Intact, Dental Advisory Given   Pulmonary neg pulmonary ROS   breath sounds clear to auscultation       Cardiovascular negative cardio ROS  Rhythm:Regular Rate:Normal     Neuro/Psych negative neurological ROS     GI/Hepatic negative GI ROS, Neg liver ROS,,,  Endo/Other  negative endocrine ROS    Renal/GU negative Renal ROS     Musculoskeletal negative musculoskeletal ROS (+)    Abdominal   Peds  Hematology negative hematology ROS (+)   Anesthesia Other Findings   Reproductive/Obstetrics                             Anesthesia Physical Anesthesia Plan  ASA: 1  Anesthesia Plan: General   Post-op Pain Management: Regional block*   Induction: Intravenous  PONV Risk Score and Plan: 4 or greater and Ondansetron, Dexamethasone, TIVA, Midazolam and Scopolamine patch - Pre-op  Airway Management Planned: LMA  Additional Equipment: None  Intra-op Plan:   Post-operative Plan: Extubation in OR  Informed Consent: I have reviewed the patients History and Physical, chart, labs and discussed the procedure including the risks, benefits and alternatives for the proposed anesthesia with the patient or authorized representative who has indicated his/her understanding and acceptance.     Dental advisory given  Plan Discussed with: CRNA  Anesthesia Plan Comments:        Anesthesia Quick Evaluation

## 2022-08-29 ENCOUNTER — Encounter (HOSPITAL_BASED_OUTPATIENT_CLINIC_OR_DEPARTMENT_OTHER): Payer: Self-pay | Admitting: Orthopaedic Surgery

## 2022-08-29 MED ORDER — ROPIVACAINE HCL 5 MG/ML IJ SOLN
INTRAMUSCULAR | Status: DC | PRN
  Administered 2022-08-26: 20 mL via PERINEURAL

## 2022-08-29 NOTE — Anesthesia Procedure Notes (Signed)
Anesthesia Regional Block: Adductor canal block   Pre-Anesthetic Checklist: , timeout performed,  Correct Patient, Correct Site, Correct Laterality,  Correct Procedure, Correct Position, site marked,  Risks and benefits discussed,  Surgical consent,  Pre-op evaluation,  At surgeon's request and post-op pain management  Laterality: Left  Prep: chloraprep       Needles:  Injection technique: Single-shot  Needle Type: Echogenic Stimulator Needle     Needle Length: 9cm  Needle Gauge: 21     Additional Needles:   Procedures:,,,, ultrasound used (permanent image in chart),,    Narrative:  Start time: 08/26/2022 10:45 AM End time: 08/26/2022 10:55 AM Injection made incrementally with aspirations every 5 mL.  Performed by: Personally  Anesthesiologist: Shelton Silvas, MD  Additional Notes: Discussed risks and benefits of the nerve block in detail, including but not limited vascular injury, permanent nerve damage and infection.   Patient tolerated the procedure well. Local anesthetic introduced in an incremental fashion under minimal resistance after negative aspirations. No paresthesias were elicited. After completion of the procedure, no acute issues were identified and patient continued to be monitored by RN.

## 2022-08-29 NOTE — Addendum Note (Signed)
Addendum  created 08/29/22 1114 by Shelton Silvas, MD   Child order released for a procedure order, Clinical Note Signed, Intraprocedure Blocks edited, Intraprocedure Meds edited, SmartForm saved

## 2022-09-05 ENCOUNTER — Encounter: Payer: Self-pay | Admitting: Physician Assistant

## 2022-09-05 ENCOUNTER — Ambulatory Visit (INDEPENDENT_AMBULATORY_CARE_PROVIDER_SITE_OTHER): Payer: BC Managed Care – PPO | Admitting: Physician Assistant

## 2022-09-05 DIAGNOSIS — S83242A Other tear of medial meniscus, current injury, left knee, initial encounter: Secondary | ICD-10-CM

## 2022-09-05 NOTE — Progress Notes (Signed)
Office Visit Note   Patient: Gloria Turner           Date of Birth: 06/21/1970           MRN: [id number] Visit Date: 09/05/2022              Requested by: Chilton Greathouse, MD [address] [place],  [place] [address] PCP: Chilton Greathouse, MD  Chief Complaint  Patient presents with   Left Knee - Follow-up    Left knee scope 08/26/2022      HPI: Ms. Wooton is a pleasant 52 year old woman who is 9 days status post left knee arthroscopy partial medial meniscectomy and debridement.  She denies fever, chills, or calf pain.  She did take out her stitches on Friday at the 1 week mark as she was having itching and a skin reaction.  Assessment & Plan: Visit Diagnoses: Status post left knee arthroscopy  Plan: 9 days status post above procedure, doing well.  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  I think she did have a little reaction to the sutures but there is no evidence of any infection.  Follow-Up Instructions: Return in about 5 weeks (around 10/10/2022).   Ortho Exam  Patient is alert, oriented, no adenopathy, well-dressed, normal affect, normal respiratory effort. Patient appears well minimal soft tissue swelling slight ecchymosis of the left knee.  Compartments are soft and nontender negative Denna Haggard' sign she is neurovascular intact.  She has well-healed surgical portals.  She does have some areas around the portals that appear to be an allergic reaction or dermatitis.  There is no drainage no evidence of any infection no cellulitis  Imaging: No results found. No images are attached to the encounter.  Labs: Lab Results  Component Value Date   ESRSEDRATE 2 05/24/2021     No results found for: "ALBUMIN", "PREALBUMIN", "CBC"  No results found for: "MG" Lab Results  Component Value Date   VD25OH 28 (L) 05/24/2021    No results found for: "PREALBUMIN"    Latest Ref Rng & Units 12/11/2020    2:15 PM  CBC EXTENDED  WBC 3.4  - 10.8 x10E3/uL 4.6   RBC 3.77 - 5.28 x10E6/uL 4.67   Hemoglobin 11.1 - 15.9 g/dL 19.1   HCT 47.8 - 29.5 % 43.8   Platelets 150 - 450 x10E3/uL 232      There is no height or weight on file to calculate BMI.  Orders:  No orders of the defined types were placed in this encounter.  No orders of the defined types were placed in this encounter.    Procedures: No procedures performed  Clinical Data: No additional findings.  ROS:  All other systems negative, except as noted in the HPI. Review of Systems  Objective: Vital Signs: LMP 07/18/2020 (Approximate)   Specialty Comments:  No specialty comments available.  PMFS History: Patient Active Problem List   Diagnosis Date Noted   Acute medial meniscus tear of left knee 08/25/2022   Left sided numbness 06/13/2021   Acute focal neurological deficit 06/13/2021   Supraventricular tachycardia 05/24/2021   Seasonal allergies 05/24/2021   Past Medical History:  Diagnosis Date   Allergy    seasonal allergies    Family History  Problem Relation Age of Onset   Colon polyps Father 53   Colon cancer Maternal Grandmother 55   Colon polyps Maternal Grandmother 55   Colon cancer Maternal Grandfather 55   Colon polyps  Maternal Grandfather 75   Stomach cancer Paternal Grandmother 16   Healthy Daughter    Healthy Son    Multiple sclerosis Maternal Aunt    Multiple sclerosis Cousin    Esophageal cancer Neg Hx    Rectal cancer Neg Hx     Past Surgical History:  Procedure Laterality Date   CESAREAN SECTION     x 2   CHONDROPLASTY Left 08/26/2022   Procedure: CHONDROPLASTY;  Surgeon: Tarry Kos, MD;  Location: [facility];  Service: Orthopedics;  Laterality: Left;   COLONOSCOPY WITH PROPOFOL N/A 08/18/2020   Procedure: COLONOSCOPY WITH PROPOFOL;  Surgeon: Iva Boop, MD;  Location: [facility];  Service: Endoscopy;  Laterality: N/A;   KNEE ARTHROSCOPY WITH MEDIAL MENISECTOMY Left 08/26/2022   Procedure:  LEFT KNEE ARTHROSCOPY, PARTIAL MEDIAL MENISCECTOMY;  Surgeon: Tarry Kos, MD;  Location: [facility];  Service: Orthopedics;  Laterality: Left;   WISDOM TOOTH EXTRACTION     Social History   Occupational History   Not on file  Tobacco Use   Smoking status: Never   Smokeless tobacco: Never  Vaping Use   Vaping Use: Never used  Substance and Sexual Activity   Alcohol use: Yes    Alcohol/week: 4.0 standard drinks of alcohol    Types: 4 Standard drinks or equivalent per week    Comment: occ   Drug use: Never   Sexual activity: Yes

## 2022-09-30 DIAGNOSIS — E785 Hyperlipidemia, unspecified: Secondary | ICD-10-CM | POA: Diagnosis not present

## 2022-10-11 ENCOUNTER — Encounter: Payer: Self-pay | Admitting: Orthopaedic Surgery

## 2022-10-11 ENCOUNTER — Ambulatory Visit (INDEPENDENT_AMBULATORY_CARE_PROVIDER_SITE_OTHER): Payer: BC Managed Care – PPO | Admitting: Orthopaedic Surgery

## 2022-10-11 DIAGNOSIS — S83242A Other tear of medial meniscus, current injury, left knee, initial encounter: Secondary | ICD-10-CM

## 2022-10-11 NOTE — Progress Notes (Signed)
Post-Op Visit Note   Patient: Gloria Turner           Date of Birth: 1970/09/21           MRN: [id number] Visit Date: 10/11/2022 PCP: Chilton Greathouse, MD   Assessment & Plan:  Chief Complaint:  Chief Complaint  Patient presents with   Left Knee - Follow-up    Scope 08/26/2022   Visit Diagnoses:  1. Acute medial meniscus tear of left knee, initial encounter     Plan: Ms. Ostrovsky is status post left knee arthroscopy partial medial meniscectomy on 08/26/2022.  She is doing well overall.  She has basically resumed all of her activities.  Examination left knee shows fully healed surgical scars.  No joint effusion.  Normal range of motion.  She has a small palpable Baker's cyst.  Overall Ms. Klomp has done very well from the surgery and she is very pleased.  She can continue to advance activity as tolerated.  If the Baker's cyst becomes more bothersome she could consider ultrasound guided aspiration and steroid injection.  She will let me know if she decides she wants to do this.  Otherwise we can see her back as needed.  Follow-Up Instructions: No follow-ups on file.   Orders:  No orders of the defined types were placed in this encounter.  No orders of the defined types were placed in this encounter.   Imaging: No results found.  PMFS History: Patient Active Problem List   Diagnosis Date Noted   Acute medial meniscus tear of left knee 08/25/2022   Left sided numbness 06/13/2021   Acute focal neurological deficit 06/13/2021   Supraventricular tachycardia 05/24/2021   Seasonal allergies 05/24/2021   Past Medical History:  Diagnosis Date   Allergy    seasonal allergies    Family History  Problem Relation Age of Onset   Colon polyps Father 20   Colon cancer Maternal Grandmother 55   Colon polyps Maternal Grandmother 55   Colon cancer Maternal Grandfather 55   Colon polyps Maternal Grandfather 35   Stomach cancer Paternal Grandmother 42   Healthy Daughter     Healthy Son    Multiple sclerosis Maternal Aunt    Multiple sclerosis Cousin    Esophageal cancer Neg Hx    Rectal cancer Neg Hx     Past Surgical History:  Procedure Laterality Date   CESAREAN SECTION     x 2   CHONDROPLASTY Left 08/26/2022   Procedure: CHONDROPLASTY;  Surgeon: Tarry Kos, MD;  Location: [facility];  Service: Orthopedics;  Laterality: Left;   COLONOSCOPY WITH PROPOFOL N/A 08/18/2020   Procedure: COLONOSCOPY WITH PROPOFOL;  Surgeon: Iva Boop, MD;  Location: [facility];  Service: Endoscopy;  Laterality: N/A;   KNEE ARTHROSCOPY WITH MEDIAL MENISECTOMY Left 08/26/2022   Procedure: LEFT KNEE ARTHROSCOPY, PARTIAL MEDIAL MENISCECTOMY;  Surgeon: Tarry Kos, MD;  Location: [facility];  Service: Orthopedics;  Laterality: Left;   WISDOM TOOTH EXTRACTION     Social History   Occupational History   Not on file  Tobacco Use   Smoking status: Never   Smokeless tobacco: Never  Vaping Use   Vaping Use: Never used  Substance and Sexual Activity   Alcohol use: Yes    Alcohol/week: 4.0 standard drinks of alcohol    Types: 4 Standard drinks or equivalent per week    Comment: occ   Drug use: Never   Sexual activity: Yes

## 2022-10-14 DIAGNOSIS — Z0000 Encounter for general adult medical examination without abnormal findings: Secondary | ICD-10-CM | POA: Diagnosis not present

## 2022-10-14 DIAGNOSIS — Z23 Encounter for immunization: Secondary | ICD-10-CM | POA: Diagnosis not present

## 2022-12-05 DIAGNOSIS — Z124 Encounter for screening for malignant neoplasm of cervix: Secondary | ICD-10-CM | POA: Diagnosis not present

## 2022-12-05 DIAGNOSIS — Z1231 Encounter for screening mammogram for malignant neoplasm of breast: Secondary | ICD-10-CM | POA: Diagnosis not present

## 2022-12-05 DIAGNOSIS — Z1151 Encounter for screening for human papillomavirus (HPV): Secondary | ICD-10-CM | POA: Diagnosis not present

## 2022-12-05 DIAGNOSIS — Z01419 Encounter for gynecological examination (general) (routine) without abnormal findings: Secondary | ICD-10-CM | POA: Diagnosis not present

## 2022-12-05 DIAGNOSIS — Z681 Body mass index (BMI) 19 or less, adult: Secondary | ICD-10-CM | POA: Diagnosis not present

## 2023-02-18 DIAGNOSIS — Z23 Encounter for immunization: Secondary | ICD-10-CM | POA: Diagnosis not present

## 2023-08-29 ENCOUNTER — Telehealth: Payer: Self-pay | Admitting: Neurology

## 2023-08-29 NOTE — Telephone Encounter (Signed)
 Appointment made

## 2023-09-04 DIAGNOSIS — D2261 Melanocytic nevi of right upper limb, including shoulder: Secondary | ICD-10-CM | POA: Diagnosis not present

## 2023-09-04 DIAGNOSIS — Z419 Encounter for procedure for purposes other than remedying health state, unspecified: Secondary | ICD-10-CM | POA: Diagnosis not present

## 2023-09-04 DIAGNOSIS — D2271 Melanocytic nevi of right lower limb, including hip: Secondary | ICD-10-CM | POA: Diagnosis not present

## 2023-09-04 DIAGNOSIS — D2221 Melanocytic nevi of right ear and external auricular canal: Secondary | ICD-10-CM | POA: Diagnosis not present

## 2023-10-05 DIAGNOSIS — E785 Hyperlipidemia, unspecified: Secondary | ICD-10-CM | POA: Diagnosis not present

## 2023-10-05 DIAGNOSIS — Z1212 Encounter for screening for malignant neoplasm of rectum: Secondary | ICD-10-CM | POA: Diagnosis not present

## 2023-10-18 DIAGNOSIS — Z0000 Encounter for general adult medical examination without abnormal findings: Secondary | ICD-10-CM | POA: Diagnosis not present

## 2023-10-18 DIAGNOSIS — Z1331 Encounter for screening for depression: Secondary | ICD-10-CM | POA: Diagnosis not present

## 2023-10-18 DIAGNOSIS — I471 Supraventricular tachycardia, unspecified: Secondary | ICD-10-CM | POA: Diagnosis not present

## 2023-10-18 DIAGNOSIS — Z1339 Encounter for screening examination for other mental health and behavioral disorders: Secondary | ICD-10-CM | POA: Diagnosis not present

## 2023-10-18 DIAGNOSIS — R82998 Other abnormal findings in urine: Secondary | ICD-10-CM | POA: Diagnosis not present

## 2023-12-26 DIAGNOSIS — Z01419 Encounter for gynecological examination (general) (routine) without abnormal findings: Secondary | ICD-10-CM | POA: Diagnosis not present

## 2023-12-26 DIAGNOSIS — Z1231 Encounter for screening mammogram for malignant neoplasm of breast: Secondary | ICD-10-CM | POA: Diagnosis not present

## 2023-12-26 DIAGNOSIS — Z681 Body mass index (BMI) 19 or less, adult: Secondary | ICD-10-CM | POA: Diagnosis not present

## 2023-12-29 DIAGNOSIS — Z13820 Encounter for screening for osteoporosis: Secondary | ICD-10-CM | POA: Diagnosis not present

## 2024-01-22 ENCOUNTER — Telehealth: Payer: Self-pay | Admitting: Neurology

## 2024-01-22 NOTE — Telephone Encounter (Signed)
 Request to cx appointment, not needed at this time

## 2024-01-30 ENCOUNTER — Ambulatory Visit: Admitting: Neurology
